# Patient Record
Sex: Female | Born: 1985 | Hispanic: No | Marital: Single | Smoking: Never smoker
Health system: Southern US, Community
[De-identification: ages and names within clinical notes are randomized; demographics above are authoritative.]

## PROBLEM LIST (undated history)

## (undated) DIAGNOSIS — F410 Panic disorder [episodic paroxysmal anxiety] without agoraphobia: Secondary | ICD-10-CM

## (undated) DIAGNOSIS — R569 Unspecified convulsions: Secondary | ICD-10-CM

## (undated) DIAGNOSIS — R011 Cardiac murmur, unspecified: Secondary | ICD-10-CM

## (undated) DIAGNOSIS — A64 Unspecified sexually transmitted disease: Secondary | ICD-10-CM

## (undated) DIAGNOSIS — F329 Major depressive disorder, single episode, unspecified: Secondary | ICD-10-CM

## (undated) DIAGNOSIS — I499 Cardiac arrhythmia, unspecified: Secondary | ICD-10-CM

## (undated) DIAGNOSIS — B977 Papillomavirus as the cause of diseases classified elsewhere: Secondary | ICD-10-CM

## (undated) DIAGNOSIS — F411 Generalized anxiety disorder: Secondary | ICD-10-CM

## (undated) HISTORY — DX: Generalized anxiety disorder: F41.1

## (undated) HISTORY — PX: TOOTH EXTRACTION: SUR596

## (undated) HISTORY — DX: Major depressive disorder, single episode, unspecified: F32.9

## (undated) HISTORY — DX: Panic disorder (episodic paroxysmal anxiety): F41.0

## (undated) HISTORY — DX: Unspecified convulsions: R56.9

## (undated) HISTORY — DX: Unspecified sexually transmitted disease: A64

## (undated) HISTORY — DX: Papillomavirus as the cause of diseases classified elsewhere: B97.7

## (undated) HISTORY — DX: Cardiac arrhythmia, unspecified: I49.9

---
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
## 2006-05-24 ENCOUNTER — Ambulatory Visit: Payer: Self-pay | Admitting: Psychiatry

## 2006-05-24 ENCOUNTER — Inpatient Hospital Stay (HOSPITAL_COMMUNITY): Admission: AD | Admit: 2006-05-24 | Discharge: 2006-05-27 | Payer: Self-pay | Admitting: Psychiatry

## 2015-02-27 DIAGNOSIS — G4700 Insomnia, unspecified: Secondary | ICD-10-CM | POA: Insufficient documentation

## 2015-02-27 DIAGNOSIS — K219 Gastro-esophageal reflux disease without esophagitis: Secondary | ICD-10-CM | POA: Insufficient documentation

## 2015-02-27 DIAGNOSIS — G40909 Epilepsy, unspecified, not intractable, without status epilepticus: Secondary | ICD-10-CM | POA: Insufficient documentation

## 2015-07-09 ENCOUNTER — Encounter: Payer: Self-pay | Admitting: *Deleted

## 2015-07-12 ENCOUNTER — Ambulatory Visit (INDEPENDENT_AMBULATORY_CARE_PROVIDER_SITE_OTHER): Payer: PRIVATE HEALTH INSURANCE | Admitting: Cardiology

## 2015-07-12 ENCOUNTER — Encounter: Payer: Self-pay | Admitting: Cardiology

## 2015-07-12 ENCOUNTER — Encounter: Payer: Self-pay | Admitting: *Deleted

## 2015-07-12 VITALS — BP 130/80 | HR 77 | Ht 64.0 in | Wt 233.0 lb

## 2015-07-12 DIAGNOSIS — I10 Essential (primary) hypertension: Secondary | ICD-10-CM | POA: Diagnosis not present

## 2015-07-12 DIAGNOSIS — F419 Anxiety disorder, unspecified: Secondary | ICD-10-CM | POA: Diagnosis not present

## 2015-07-12 DIAGNOSIS — R42 Dizziness and giddiness: Secondary | ICD-10-CM

## 2015-07-12 DIAGNOSIS — Z8679 Personal history of other diseases of the circulatory system: Secondary | ICD-10-CM | POA: Insufficient documentation

## 2015-07-12 DIAGNOSIS — R011 Cardiac murmur, unspecified: Secondary | ICD-10-CM

## 2015-07-12 DIAGNOSIS — R002 Palpitations: Secondary | ICD-10-CM | POA: Insufficient documentation

## 2015-07-12 LAB — T4, FREE: Free T4: 1 ng/dL (ref 0.8–1.8)

## 2015-07-12 LAB — COMPREHENSIVE METABOLIC PANEL
ALT: 24 U/L (ref 6–29)
AST: 15 U/L (ref 10–30)
Albumin: 4.2 g/dL (ref 3.6–5.1)
Alkaline Phosphatase: 107 U/L (ref 33–115)
BUN: 13 mg/dL (ref 7–25)
CO2: 20 mmol/L (ref 20–31)
Calcium: 9.2 mg/dL (ref 8.6–10.2)
Chloride: 107 mmol/L (ref 98–110)
Creat: 0.8 mg/dL (ref 0.50–1.10)
Glucose, Bld: 90 mg/dL (ref 65–99)
Potassium: 4.3 mmol/L (ref 3.5–5.3)
Sodium: 138 mmol/L (ref 135–146)
Total Bilirubin: 0.5 mg/dL (ref 0.2–1.2)
Total Protein: 7 g/dL (ref 6.1–8.1)

## 2015-07-12 LAB — CBC WITH DIFFERENTIAL/PLATELET
Basophils Absolute: 0 cells/uL (ref 0–200)
Basophils Relative: 0 %
Eosinophils Absolute: 118 cells/uL (ref 15–500)
Eosinophils Relative: 2 %
HCT: 39.2 % (ref 35.0–45.0)
Hemoglobin: 13.6 g/dL (ref 11.7–15.5)
Lymphocytes Relative: 35 %
Lymphs Abs: 2065 cells/uL (ref 850–3900)
MCH: 28.8 pg (ref 27.0–33.0)
MCHC: 34.7 g/dL (ref 32.0–36.0)
MCV: 82.9 fL (ref 80.0–100.0)
MPV: 8.7 fL (ref 7.5–12.5)
Monocytes Absolute: 531 cells/uL (ref 200–950)
Monocytes Relative: 9 %
Neutro Abs: 3186 cells/uL (ref 1500–7800)
Neutrophils Relative %: 54 %
Platelets: 249 10*3/uL (ref 140–400)
RBC: 4.73 MIL/uL (ref 3.80–5.10)
RDW: 13.4 % (ref 11.0–15.0)
WBC: 5.9 10*3/uL (ref 3.8–10.8)

## 2015-07-12 LAB — TSH: TSH: 1.17 mIU/L

## 2015-07-12 NOTE — Progress Notes (Signed)
Cardiology Office Note    Date:  07/12/2015   ID:  Catherine Shah, DOB [birth year], MRN [id number]  PCP:  Catherine Shah,HEATHER M., MD  Cardiologist:  Catherine Shah, Catherine Shah H, MD   No chief complaint on file.   History of Present Illness:  Catherine Shah is a 30 y.o. female with prior medical history of depression, anxiety and obesity who is coming for concerns of palpitations and dizziness. The patient states that she has been experiencing episodes of skipped beats and 2 seconds to minutes lasting palpitations that are associated with significant anxiety patient side however no associated shortness of breath chest pain or dizziness. She is also experiencing episodes of dizziness but no presyncope or syncope. The patient works as a Veterinary surgeoncounselor, and is planning to return back to school to get Master degree. She is seeing a mental health specialist for anxiety depression and panic attacks. She is also using niacin for weight loss. She states that in the past she used phentermine for weight loss and that's when her problems started. She has never smoked, her mother had stroke at age of 30 and passed away from complications. Her aunt had heart surgery in her 30s but she doesn't know the reason, several of her cousins have mitral valve prolapse.The patient also states that one occasion when she had episodes of dizziness her colic who is a nurse check her blood pressure and it was elevated but she doesn't remember the exact number.  Past Medical History  Diagnosis Date  . Generalized anxiety disorder   . Panic attacks   . Major depression (HCC)    No past surgical history on file.  Current Medications: No outpatient prescriptions prior to visit.   No facility-administered medications prior to visit.    Allergies:   Tape   Social History   Social History  . Marital Status: Single    Spouse Name: N/A  . Number of Children: 0  . Years of Education: N/A   Social History Main Topics  . Smoking status: Never Smoker    . Smokeless tobacco: Never Used  . Alcohol Use: No  . Drug Use: No  . Sexual Activity: Not on file   Other Topics Concern  . Not on file   Social History Narrative    Family History:  The patient's family history is not on file.   ROS:   Please see the history of present illness.    ROS All other systems reviewed and are negative.   PHYSICAL EXAM:   VS:  BP 130/80 mmHg  Pulse 77  Ht 5\' 4"  (1.626 m)  Wt 233 lb (105.688 kg)  BMI 39.97 kg/m2   GEN: Well nourished, well developed, in no acute distress HEENT: normal Neck: no JVD, carotid bruits, or masses Cardiac: RRR; 1/6 systolic murmurs, rubs, or gallops,no edema  Respiratory:  clear to auscultation bilaterally, normal work of breathing GI: soft, nontender, nondistended, + BS MS: no deformity or atrophy Skin: warm and dry, no rash Neuro:  Alert and Oriented x 3, Strength and sensation are intact Psych: euthymic mood, full affect  Wt Readings from Last 3 Encounters:  07/12/15 233 lb (105.688 kg)      Studies/Labs Reviewed:   EKG:  EKG is ordered today.  The ekg ordered today demonstrates SR, normal ECG.  Recent Labs: No results found for requested labs within last 365 days.   Lipid Panel No results found for: CHOL, TRIG, HDL, CHOLHDL, VLDL, LDLCALC, LDLDIRECT    ASSESSMENT:  1. Palpitations   2. Dizziness   3. Anxiety   4. Essential hypertension   5. Systolic murmur     PLAN:   We'll schedule 48 hour Holter monitor to evaluate for possible arrhythmias, considering her palpitations and prior use of phentermine we will evaluate echocardiogram for evaluation of systolic diastolic function and valvular abnormalities, she has short systolic murmur and phentermine is known for causing valvular regurgitation. We will also evaluate labs including electrolytes kidney and liver function and TSH and free T4. We will also check for CBC. On her echocardiogram we will also look for degree of LVH as she describes that  she had episodes of hypertension, this will help [place] determine if her hypertension is chronic only during episodes when she is stressed.   Medication Adjustments/Labs and Tests Ordered: Current medicines are reviewed at length with the patient today.  Concerns regarding medicines are outlined above.  Medication changes, Labs and Tests ordered today are listed in the Patient Instructions below. Patient Instructions  Medication Instructions:   Your physician recommends that you continue on your current medications as directed. Please refer to the Current Medication list given to you today.    Labwork:  TODAY--CMET, CBC W DIFF, TSH, AND FREE T 4    Testing/Procedures:  Your physician has requested that you have an echocardiogram. Echocardiography is a painless test that uses sound waves to create images of your heart. It provides your doctor with information about the size and shape of your heart and how well your heart's chambers and valves are working. This procedure takes approximately one hour. There are no restrictions for this procedure.   Your physician has recommended that you wear a 48 HOUR holter monitor. Holter monitors are medical devices that record the heart's electrical activity. Doctors most often use these monitors to diagnose arrhythmias. Arrhythmias are problems with the speed or rhythm of the heartbeat. The monitor is a small, portable device. You can wear one while you do your normal daily activities. This is usually used to diagnose what is causing palpitations/syncope (passing out).     Follow-Up:  AT DR Tyler Robidoux'S NEXT AVAILABLE APPOINTMENT--PLEASE HAVE ALL TESTING DONE PRIOR TOO THIS APPOINTMENT      If you need a refill on your cardiac medications before your next appointment, please call your pharmacy.      Signed, Catherine Masson, MD  07/12/2015 11:22 AM    [facility] [address] [place], [place], [place]  [address] Phone: [phone]; Fax: [phone]

## 2015-07-12 NOTE — Patient Instructions (Signed)
Medication Instructions:   Your physician recommends that you continue on your current medications as directed. Please refer to the Current Medication list given to you today.    Labwork:  TODAY--CMET, CBC W DIFF, TSH, AND FREE T 4    Testing/Procedures:  Your physician has requested that you have an echocardiogram. Echocardiography is a painless test that uses sound waves to create images of your heart. It provides your doctor with information about the size and shape of your heart and how well your heart's chambers and valves are working. This procedure takes approximately one hour. There are no restrictions for this procedure.   Your physician has recommended that you wear a 48 HOUR holter monitor. Holter monitors are medical devices that record the heart's electrical activity. Doctors most often use these monitors to diagnose arrhythmias. Arrhythmias are problems with the speed or rhythm of the heartbeat. The monitor is a small, portable device. You can wear one while you do your normal daily activities. This is usually used to diagnose what is causing palpitations/syncope (passing out).     Follow-Up:  AT DR NELSON'S NEXT AVAILABLE APPOINTMENT--PLEASE HAVE ALL TESTING DONE PRIOR TOO THIS APPOINTMENT      If you need a refill on your cardiac medications before your next appointment, please call your pharmacy.

## 2015-07-16 ENCOUNTER — Telehealth: Payer: Self-pay | Admitting: Cardiology

## 2015-07-16 NOTE — Telephone Encounter (Signed)
Left a message for the pt to call back to endorse lab results per Dr Nelson.  

## 2015-07-16 NOTE — Telephone Encounter (Signed)
Notes Recorded by Loa SocksIvy M Martin, LPN on 1/6/10965/10/2015 at 3:46 PM Notified the pt that per Dr Delton SeeNelson, her labs were normal, including her thyroid.  Pt verbalized understanding.

## 2015-07-16 NOTE — Telephone Encounter (Signed)
New message ° ° ° ° ° ° °Calling to get lab results °

## 2015-07-30 ENCOUNTER — Telehealth: Payer: Self-pay | Admitting: Cardiology

## 2015-07-30 ENCOUNTER — Ambulatory Visit (INDEPENDENT_AMBULATORY_CARE_PROVIDER_SITE_OTHER): Payer: PRIVATE HEALTH INSURANCE

## 2015-07-30 ENCOUNTER — Ambulatory Visit (HOSPITAL_COMMUNITY): Payer: PRIVATE HEALTH INSURANCE | Attending: Cardiology

## 2015-07-30 DIAGNOSIS — R002 Palpitations: Secondary | ICD-10-CM | POA: Insufficient documentation

## 2015-07-30 DIAGNOSIS — I071 Rheumatic tricuspid insufficiency: Secondary | ICD-10-CM | POA: Diagnosis not present

## 2015-07-30 DIAGNOSIS — R42 Dizziness and giddiness: Secondary | ICD-10-CM | POA: Diagnosis not present

## 2015-07-30 NOTE — Telephone Encounter (Signed)
Notified the pt that per Dr Delton SeeNelson, her echo showed normal LVEF, trivial MR, and mild TR, but overall normal echo.  Pt verbalized understanding.

## 2015-07-30 NOTE — Telephone Encounter (Signed)
Follow-up ° ° ° °The pt is calling back to get lab results °

## 2015-08-08 ENCOUNTER — Encounter: Payer: Self-pay | Admitting: Cardiology

## 2015-08-08 ENCOUNTER — Ambulatory Visit (INDEPENDENT_AMBULATORY_CARE_PROVIDER_SITE_OTHER): Payer: PRIVATE HEALTH INSURANCE | Admitting: Cardiology

## 2015-08-08 VITALS — BP 120/80 | HR 78 | Ht 64.0 in | Wt 232.4 lb

## 2015-08-08 DIAGNOSIS — I10 Essential (primary) hypertension: Secondary | ICD-10-CM

## 2015-08-08 DIAGNOSIS — R002 Palpitations: Secondary | ICD-10-CM

## 2015-08-08 DIAGNOSIS — R011 Cardiac murmur, unspecified: Secondary | ICD-10-CM | POA: Diagnosis not present

## 2015-08-08 DIAGNOSIS — R42 Dizziness and giddiness: Secondary | ICD-10-CM | POA: Diagnosis not present

## 2015-08-08 NOTE — Progress Notes (Signed)
Cardiology Office Note    Date:  08/09/2015   ID:  Catherine Shah, DOB 08-Feb-1986, MRN [id number]  PCP:  Raynelle Jan., MD  Cardiologist:  Tobias Alexander, MD   Chief complain: Palpitations  History of Present Illness:  Catherine Shah is a 30 y.o. female with prior medical history of depression, anxiety and obesity who is coming for concerns of palpitations and dizziness. The patient states that she has been experiencing episodes of skipped beats and 2 seconds to minutes lasting palpitations that are associated with significant anxiety patient side however no associated shortness of breath chest pain or dizziness. She is also experiencing episodes of dizziness but no presyncope or syncope. 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 She is seeing a mental health specialist for anxiety depression and panic attacks. She is also using niacin for weight loss. She states that in the past she used phentermine for weight loss and that's when her problems started. She has never smoked, her mother had stroke at age of 24 and passed away from complications. Her aunt had heart surgery in her 30s but she doesn't know the reason, several of her cousins have mitral valve prolapse.The patient also states that one occasion when she had episodes of dizziness her colic who is a nurse check her blood pressure and it was elevated but she doesn't remember the exact number.  3 weeks follow up, no change in symptoms, no presyncope or syncope in the interim. She underwent echocardiography with normal LVEF, trivial MR, and mild TR, no LVH. She has normal labs including TSH. 48 H Holter monitor showed Couple PVCs, infrequent PACs. No significant pauses.  Past Medical History  Diagnosis Date  . Generalized anxiety disorder   . Panic attacks   . Major depression (HCC)    No past surgical history on file.  Current Medications: Outpatient Prescriptions Prior to Visit    Medication Sig Dispense Refill  . amitriptyline (ELAVIL) 25 MG tablet Take 25 mg by mouth at bedtime.    Marland Kitchen CALCIUM-MAG-VIT C-VIT D PO Take 1 tablet by mouth daily.    . citalopram (CELEXA) 20 MG tablet Take 20 mg by mouth daily.    . clonazePAM (KLONOPIN) 0.5 MG tablet Take 0.5 mg by mouth daily as needed for anxiety.    Marland Kitchen doxycycline (DORYX) 150 MG EC tablet Take 150 mg by mouth daily.    Marland Kitchen etonogestrel (NEXPLANON) 68 MG IMPL implant 1 each by Subdermal route once.    . gabapentin (NEURONTIN) 800 MG tablet Take 800 mg by mouth 2 (two) times daily.    . niacin 500 MG tablet Take 500 mg by mouth at bedtime.     No facility-administered medications prior to visit.    Allergies:   Tape   Social History   Social History  . Marital Status: Single    Spouse Name: N/A  . Number of Children: 0  . Years of Education: N/A   Social History Main Topics  . Smoking status: Never Smoker   . Smokeless tobacco: Never Used  . Alcohol Use: No  . Drug Use: No  . Sexual Activity: Not Asked   Other Topics Concern  . None   Social History Narrative    Family History:  The patient's family history is not on file.   ROS:   Please see the history of present illness.    ROS All other systems reviewed and are negative.  PHYSICAL EXAM:   VS:  BP 120/80 mmHg  Pulse 78  Ht 5\' 4"  (1.626 m)  Wt 232 lb 6.4 oz (105.416 kg)  BMI 39.87 kg/m2   GEN: Well nourished, well developed, in no acute distress HEENT: normal Neck: no JVD, carotid bruits, or masses Cardiac: RRR; 1/6 systolic murmurs, rubs, or gallops,no edema  Respiratory:  clear to auscultation bilaterally, normal work of breathing GI: soft, nontender, nondistended, + BS MS: no deformity or atrophy Skin: warm and dry, no rash Neuro:  Alert and Oriented x 3, Strength and sensation are intact Psych: euthymic mood, full affect  Wt Readings from Last 3 Encounters:  08/08/15 232 lb 6.4 oz (105.416 kg)  07/12/15 233 lb (105.688 kg)       Studies/Labs Reviewed:   EKG:  EKG is ordered today.  The ekg ordered today demonstrates SR, normal ECG.  Recent Labs: 07/12/2015: ALT 24; BUN 13; Creat 0.80; Hemoglobin 13.6; Platelets 249; Potassium 4.3; Sodium 138; TSH 1.17   Lipid Panel No results found for: CHOL, TRIG, HDL, CHOLHDL, VLDL, LDLCALC, LDLDIRECT  TTE: 07/2015 - Left ventricle: The cavity size was normal. Systolic function was  normal. Wall motion was normal; there were no regional wall  motion abnormalities. - Aortic valve: Trileaflet; mildly thickened, mildly calcified  leaflets. - Mitral valve: There was trivial regurgitation.  Holter monitor: normal    ASSESSMENT:    1. Essential hypertension   2. Dizziness   3. Palpitations   4. Systolic murmur     PLAN:   Normal labs, normal echocardiogram and Holter monitor with only occasional PACs, PVS, no pauses or arrhythmias. Reassurance provided.  BP controlled today and no LVH on echocardiogram, no therapy recommended right now.  Normal labs.   Medication Adjustments/Labs and Tests Ordered: Current medicines are reviewed at length with the patient today.  Concerns regarding medicines are outlined above.  Medication changes, Labs and Tests ordered today are listed in the Patient Instructions below. Patient Instructions  Your physician recommends that you continue on your current medications as directed. Please refer to the Current Medication list given to you today.    Your physician wants you to follow-up in: ONE YEAR WITH DR Johnell ComingsNELSON You will receive a reminder letter in the mail two months in advance. If you don't receive a letter, please call our office to schedule the follow-up appointment.     Signed, Tobias AlexanderKatarina Maksymilian Mabey, MD  08/09/2015 11:04 PM    [facility] [address], [place], [place]  [address] Phone: [phone]; Fax: [phone]) [phone]

## 2015-08-08 NOTE — Patient Instructions (Signed)
Your physician recommends that you continue on your current medications as directed. Please refer to the Current Medication list given to you today.   Your physician wants you to follow-up in: ONE YEAR WITH DR NELSON You will receive a reminder letter in the mail two months in advance. If you don't receive a letter, please call our office to schedule the follow-up appointment.  

## 2016-06-26 ENCOUNTER — Encounter: Payer: PRIVATE HEALTH INSURANCE | Admitting: Podiatry

## 2016-07-07 NOTE — Progress Notes (Signed)
This encounter was created in error - please disregard.

## 2018-01-05 DIAGNOSIS — K645 Perianal venous thrombosis: Secondary | ICD-10-CM | POA: Insufficient documentation

## 2018-11-06 DIAGNOSIS — M25562 Pain in left knee: Secondary | ICD-10-CM | POA: Insufficient documentation

## 2018-11-06 DIAGNOSIS — Z6841 Body Mass Index (BMI) 40.0 and over, adult: Secondary | ICD-10-CM | POA: Insufficient documentation

## 2019-02-09 ENCOUNTER — Other Ambulatory Visit: Payer: Self-pay

## 2019-02-09 DIAGNOSIS — Z20822 Contact with and (suspected) exposure to covid-19: Secondary | ICD-10-CM

## 2019-02-11 LAB — NOVEL CORONAVIRUS, NAA: SARS-CoV-2, NAA: NOT DETECTED

## 2019-07-25 ENCOUNTER — Other Ambulatory Visit: Payer: Self-pay

## 2019-07-25 ENCOUNTER — Encounter: Payer: Self-pay | Admitting: Family Medicine

## 2019-07-25 ENCOUNTER — Ambulatory Visit (INDEPENDENT_AMBULATORY_CARE_PROVIDER_SITE_OTHER): Payer: BC Managed Care – PPO | Admitting: Family Medicine

## 2019-07-25 VITALS — BP 118/82 | HR 81 | Temp 98.1°F | Ht 63.75 in | Wt 248.4 lb

## 2019-07-25 DIAGNOSIS — M25561 Pain in right knee: Secondary | ICD-10-CM

## 2019-07-25 DIAGNOSIS — I10 Essential (primary) hypertension: Secondary | ICD-10-CM

## 2019-07-25 DIAGNOSIS — G8929 Other chronic pain: Secondary | ICD-10-CM

## 2019-07-25 DIAGNOSIS — Z7689 Persons encountering health services in other specified circumstances: Secondary | ICD-10-CM

## 2019-07-25 DIAGNOSIS — Z6841 Body Mass Index (BMI) 40.0 and over, adult: Secondary | ICD-10-CM

## 2019-07-25 DIAGNOSIS — M25562 Pain in left knee: Secondary | ICD-10-CM

## 2019-07-25 NOTE — Patient Instructions (Signed)
Instant pot recipes- Amy and Richelle Ito resource that I like is www.dietdoctor.com You tube- Dr. Wylene Simmer, Dr. Shanda Howells  Here are some guidelines to help you with meal planning -  Avoid all processed and packaged foods (bread, pasta, crackers, chips, etc) and beverages containing calories.  Avoid added sugars and excessive natural sugars.  Attention to how you feel if you consume artificial sweeteners.  Do they make you more hungry or raise your blood sugar?  With every meal and snack, aim to get 20 g of protein (3 ounces of meat, 4 ounces of fish, 3 eggs, protein powder, 1 cup [place] yogurt, 1 cup cottage cheese, etc.)  Increase fiber in the form of non-starchy vegetables.  These help you feel full with very little carbohydrates and are good for gut health.  Eat 1 serving healthy carb per meal- 1/2 cup brown rice, beans, potato, corn- pay attention to whether or not this significantly raises your blood sugar. If it does, reduce the frequency you consume these.   Eat 2-3 servings of lower sugar fruits daily.  This includes berries, apples, oranges, peaches, pears, one half banana.  Have small amounts of good fats such as avocado, nuts, olive oil, nut butters, olives.  Add a little cheese to your salads to make them tasty.

## 2019-07-25 NOTE — Progress Notes (Signed)
Subjective:    Patient ID: Catherine Shah, female    DOB: 02/14/1986, 34 y.o.   MRN: [id number]  HPI Chief Complaint  Patient presents with  . New Patient (Initial Visit)    Estab Care   This is a 34 yo female who presents today to establish care. 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 Enjoys live music, yard work. Has poodle and a cat.   Last CPE- 8/20 Pap-8/20 Tdap- 11/03/2018 Flu- annual Eye- unknown Dental- regular Exercise- not regular Sleep- ok, 5-6 hours, feels rested, no snoring, no apnea Diet- has been trying to cook more. Takes her lunch- salad. Breakfast- overnight oats, Dinner- fast food  Chronic knee pain- takes meloxicam prn and gabapentin. Had PT in past. Some help with heat.   Chest pain- had an episode of sternal pain, relieved with meloxicam.    Review of Systems No SOB, abdominal pain, diarrhea, constipation, occasional back pain. No dysuria, hematuria, menstrual problems.     Objective:   Physical Exam Vitals reviewed.  Constitutional:      General: She is not in acute distress.    Appearance: Normal appearance. She is obese. She is not ill-appearing, toxic-appearing or diaphoretic.  HENT:     Head: Normocephalic and atraumatic.  Eyes:     Conjunctiva/sclera: Conjunctivae normal.  Cardiovascular:     Rate and Rhythm: Normal rate and regular rhythm.     Heart sounds: Normal heart sounds.  Musculoskeletal:     Right lower leg: No edema.     Left lower leg: No edema.  Skin:    General: Skin is warm and dry.  Neurological:     Mental Status: She is alert and oriented to person, place, and time.  Psychiatric:        Mood and Affect: Mood normal.        Behavior: Behavior normal.        Thought Content: Thought content normal.        Judgment: Judgment normal.      BP 118/82 (BP Location: Left Arm, Patient Position: Sitting, Cuff Size: Large)   Pulse 81   Temp 98.1 F (36.7 C)  (Temporal)   Ht 5' 3.75" (1.619 m)   Wt 248 lb 6.4 oz (112.7 kg)   SpO2 98%   BMI 42.97 kg/m  Wt Readings from Last 3 Encounters:  07/25/19 248 lb 6.4 oz (112.7 kg)  08/08/15 232 lb 6.4 oz (105.4 kg)  07/12/15 233 lb (105.7 kg)        Assessment & Plan:  1. Encounter to establish care - Reviewed available records, she will return for CPE/ labs  2. Morbid obesity with body mass index (BMI) of 40.0 to 44.9 in adult [facility]) - she is interested in referral to dietician - Amb ref to Medical Nutrition Therapy-MNT  3. Essential hypertension - normal blood pressure off medication, will continue to monitor  4. Chronic pain of both knees - encouraged her to be as active as possible and resume exercises she learned in PT which had been helpful.   - follow up for cpe in 3 months  This visit occurred during the SARS-CoV-2 public health emergency.  Safety protocols were in place, including screening questions prior to the visit, additional usage of staff PPE, and extensive cleaning of exam room while observing appropriate contact time as indicated for disinfecting solutions.      Clarene Reamer, FNP-BC  [facility], [facility]  07/30/2019 6:35 AM

## 2019-07-30 ENCOUNTER — Encounter: Payer: Self-pay | Admitting: Family Medicine

## 2019-08-31 ENCOUNTER — Encounter: Payer: Self-pay | Admitting: Dietician

## 2019-08-31 ENCOUNTER — Other Ambulatory Visit: Payer: Self-pay

## 2019-08-31 ENCOUNTER — Encounter: Payer: BC Managed Care – PPO | Attending: Family Medicine | Admitting: Dietician

## 2019-08-31 DIAGNOSIS — Z6841 Body Mass Index (BMI) 40.0 and over, adult: Secondary | ICD-10-CM | POA: Diagnosis not present

## 2019-08-31 NOTE — Patient Instructions (Signed)
·   Use menus and easy recipes to plan ahead for homemade, balanced meals.   Resume using MyFitnessPal to track food intake and monitor calories, carbs, etc. Goal for calories is at least 1300, up to 1500.  Work on slower eating to improve fulness and portion control. Try chewing each bite more, putting fork down between bites, taking smaller bites, drinking water after each few bites of food.   Make use of "down" time at work to increase walking/ moving.

## 2019-08-31 NOTE — Progress Notes (Signed)
Medical Nutrition Therapy: Visit start time: 0910  end time: 1010  Assessment:  Diagnosis: obesity Past medical history: none significant Psychosocial issues/ stress concerns: taking antidepressant medication  Preferred learning method:   Hands-on   Current weight: 245.1lbs  Height: 5'4" Medications, supplements: reconciled list in medical record  Progress and evaluation:   Patient reports stable weight recently.  Tried [facility]oylene Bode, bariatric clinic with phentermine but had reaction. She worked with Systems analyst in the past, and participated in [place] and kick-boxing, but has not been able to maintain.   She is working 1 full-time and 1 part-time job, days and evenings. She reports she does not enjoy cooking or grocery shopping.   Currently taking wellbutrin to help with appetite control  Physical activity: none currently; work is sedentary  Dietary Intake:  Usual eating pattern includes 3 meals and 2-3 snacks per day. Dining out frequency: 6 meals per week.  Breakfast: overnight oats; occ [employer] Mcmuffin; cottage cheese with fruit Snack: yogurt or cottage cheese Lunch: salad; fast foods ie burger and fries Snack: apple or yogurt Supper: some fast food; starting to use instapot -- shrimp and rice 6/22 Snack: "weak" time -- carbs ie chips, crackers, cookies, etc. Beverages: water; coffee/ iced coffee in am; diet coke  Nutrition Care Education: Topics covered:  Basic nutrition: basic food groups, appropriate nutrient balance, appropriate meal and snack schedule, general nutrition guidelines    Weight control: importance of low sugar and low fat choices, portion control strategies, ways to slow down eating time; estimated energy needs at 1300-1500kcal, provided guidance for 50% CHO, 20% protein; tracking food intake; role of exercise Advanced nutrition: cooking techniques-- quick meal ideas, pre-prepping foods; dining out-- healthy meal options  Nutritional Diagnosis:   Vaughn-3.3 Overweight/obesity As related to excess calories and inactivity.  As evidenced by patient with current BMI of 42.  Intervention:   Instruction and discussion as noted above.  Focused on quick and simple options for healthy meals to match patient's lifestyle and preferences.   Established goals for change with direction from patient.  Education Materials given:   Designer, industrial/product with food lists, sample meal pattern  Sample menus  "Planned-over" meal ideas (2 handouts)  Goals/ instructions   Learner/ who was taught:   Patient   Level of understanding:  Verbalizes/ demonstrates competency   Demonstrated degree of understanding via:   Teach back Learning barriers:  None   Willingness to learn/ readiness for change:  Acceptance, ready for change   Monitoring and Evaluation:  Dietary intake, exercise, and body weight      follow up: 10/03/19 at 4:30pm

## 2019-10-03 ENCOUNTER — Ambulatory Visit: Payer: BC Managed Care – PPO | Admitting: Dietician

## 2019-10-14 ENCOUNTER — Other Ambulatory Visit: Payer: Self-pay | Admitting: Family Medicine

## 2019-10-14 DIAGNOSIS — Z113 Encounter for screening for infections with a predominantly sexual mode of transmission: Secondary | ICD-10-CM

## 2019-10-14 DIAGNOSIS — I10 Essential (primary) hypertension: Secondary | ICD-10-CM

## 2019-10-21 ENCOUNTER — Other Ambulatory Visit (INDEPENDENT_AMBULATORY_CARE_PROVIDER_SITE_OTHER): Payer: BC Managed Care – PPO

## 2019-10-21 ENCOUNTER — Other Ambulatory Visit: Payer: Self-pay

## 2019-10-21 DIAGNOSIS — Z6841 Body Mass Index (BMI) 40.0 and over, adult: Secondary | ICD-10-CM | POA: Diagnosis not present

## 2019-10-21 DIAGNOSIS — I10 Essential (primary) hypertension: Secondary | ICD-10-CM

## 2019-10-21 DIAGNOSIS — Z113 Encounter for screening for infections with a predominantly sexual mode of transmission: Secondary | ICD-10-CM

## 2019-10-21 LAB — CBC WITH DIFFERENTIAL/PLATELET
Basophils Absolute: 0 10*3/uL (ref 0.0–0.1)
Basophils Relative: 0.6 % (ref 0.0–3.0)
Eosinophils Absolute: 0.2 10*3/uL (ref 0.0–0.7)
Eosinophils Relative: 2.8 % (ref 0.0–5.0)
HCT: 37.5 % (ref 36.0–46.0)
Hemoglobin: 12.6 g/dL (ref 12.0–15.0)
Lymphocytes Relative: 28 % (ref 12.0–46.0)
Lymphs Abs: 1.8 10*3/uL (ref 0.7–4.0)
MCHC: 33.6 g/dL (ref 30.0–36.0)
MCV: 86.3 fl (ref 78.0–100.0)
Monocytes Absolute: 0.4 10*3/uL (ref 0.1–1.0)
Monocytes Relative: 6 % (ref 3.0–12.0)
Neutro Abs: 4.1 10*3/uL (ref 1.4–7.7)
Neutrophils Relative %: 62.6 % (ref 43.0–77.0)
Platelets: 271 10*3/uL (ref 150.0–400.0)
RBC: 4.35 Mil/uL (ref 3.87–5.11)
RDW: 13 % (ref 11.5–15.5)
WBC: 6.6 10*3/uL (ref 4.0–10.5)

## 2019-10-21 LAB — VITAMIN D 25 HYDROXY (VIT D DEFICIENCY, FRACTURES): VITD: 21.65 ng/mL — ABNORMAL LOW (ref 30.00–100.00)

## 2019-10-21 LAB — TSH: TSH: 2.76 u[IU]/mL (ref 0.35–4.50)

## 2019-10-21 LAB — COMPREHENSIVE METABOLIC PANEL
ALT: 13 U/L (ref 0–35)
AST: 11 U/L (ref 0–37)
Albumin: 4.2 g/dL (ref 3.5–5.2)
Alkaline Phosphatase: 66 U/L (ref 39–117)
BUN: 12 mg/dL (ref 6–23)
CO2: 21 mEq/L (ref 19–32)
Calcium: 8.8 mg/dL (ref 8.4–10.5)
Chloride: 109 mEq/L (ref 96–112)
Creatinine, Ser: 0.92 mg/dL (ref 0.40–1.20)
GFR: 69.7 mL/min (ref >60.00)
Glucose, Bld: 90 mg/dL (ref 70–99)
Potassium: 4.2 mEq/L (ref 3.5–5.1)
Sodium: 139 mEq/L (ref 135–145)
Total Bilirubin: 0.5 mg/dL (ref 0.2–1.2)
Total Protein: 6.8 g/dL (ref 6.0–8.3)

## 2019-10-21 LAB — LIPID PANEL
Cholesterol: 185 mg/dL (ref 0–200)
HDL: 43.4 mg/dL (ref >39.00)
LDL Cholesterol: 125 mg/dL — ABNORMAL HIGH (ref 0–99)
NonHDL: 141.52
Total CHOL/HDL Ratio: 4
Triglycerides: 85 mg/dL (ref 0.0–149.0)
VLDL: 17 mg/dL (ref 0.0–40.0)

## 2019-10-21 LAB — HEMOGLOBIN A1C: Hgb A1c MFr Bld: 5 % (ref 4.6–6.5)

## 2019-10-21 NOTE — Addendum Note (Signed)
Addended by: Alvina Chou on: 10/21/2019 11:38 AM   Modules accepted: Orders

## 2019-10-24 LAB — HIV ANTIBODY (ROUTINE TESTING W REFLEX): HIV 1&2 Ab, 4th Generation: NONREACTIVE

## 2019-10-24 LAB — RPR: RPR Ser Ql: NONREACTIVE

## 2019-10-26 LAB — C. TRACHOMATIS/N. GONORRHOEAE RNA
C. trachomatis RNA, TMA: NOT DETECTED
N. gonorrhoeae RNA, TMA: NOT DETECTED

## 2019-10-26 LAB — TRICHOMONAS VAGINALIS, PROBE AMP: Trichomonas vaginalis RNA: NOT DETECTED

## 2019-10-28 ENCOUNTER — Encounter: Payer: Self-pay | Admitting: Family Medicine

## 2019-10-28 ENCOUNTER — Ambulatory Visit (INDEPENDENT_AMBULATORY_CARE_PROVIDER_SITE_OTHER): Payer: BC Managed Care – PPO | Admitting: Family Medicine

## 2019-10-28 ENCOUNTER — Other Ambulatory Visit: Payer: Self-pay

## 2019-10-28 VITALS — BP 118/70 | HR 95 | Temp 98.1°F | Ht 63.5 in | Wt 241.5 lb

## 2019-10-28 DIAGNOSIS — Z6841 Body Mass Index (BMI) 40.0 and over, adult: Secondary | ICD-10-CM

## 2019-10-28 DIAGNOSIS — Z309 Encounter for contraceptive management, unspecified: Secondary | ICD-10-CM

## 2019-10-28 DIAGNOSIS — E559 Vitamin D deficiency, unspecified: Secondary | ICD-10-CM

## 2019-10-28 DIAGNOSIS — Z0000 Encounter for general adult medical examination without abnormal findings: Secondary | ICD-10-CM | POA: Diagnosis not present

## 2019-10-28 MED ORDER — ETONOGESTREL-ETHINYL ESTRADIOL 0.12-0.015 MG/24HR VA RING: 1.0000 | VAGINAL_RING | VAGINAL | 4 refills | Status: DC

## 2019-10-28 NOTE — Patient Instructions (Addendum)
Vitamin D3- 1,000 IU daily  There is not one right eating plan for everyone.  It may take trial and error to find what will work for you.  It is important to get adequate protein and fiber with your meals.  It is okay to not eat breakfast or to skip meals if you are not hungry.  Avoid snacking between meals.  Unless you are on a fluid restriction, drink 80 to 90 ounces of water a day.  Suggested resources- www.dietdoctor.com/diabetes/diet www.adaptyourlifeacademy.com-there is a quiz to help you determine how many carbohydrates you should eat a day  www.thefastingmethod.com  Increase fiber in the form of non-starchy vegetables.  These help you feel full with very little carbohydrates and are good for gut health.  Nonstarchy vegetables include summer squash, onions, peppers, tomatoes, eggplant, broccoli, cauliflower, cabbage, lettuce, spinach.  Have small amounts of good fats such as avocado, nuts, olive oil, nut butters, olives.  Add a little cheese to your meals to make them tasty.   Try to plan your meals for the week and do some meal preparation when able.  If possible, make lunches for the week ahead of time.  Plan a couple of dinners and make enough so you can have leftovers.  Build in a treat once a week.     Vitamin D Deficiency Vitamin D deficiency is when your body does not have enough vitamin D. Vitamin D is important to your body because:  It helps your body use other minerals.  It helps to keep your bones strong and healthy.  It may help to prevent some diseases.  It helps your heart and other muscles work well. Not getting enough vitamin D can make your bones soft. It can also cause other health problems. What are the causes? This condition may be caused by:  Not eating enough foods that contain vitamin D.  Not getting enough sun.  Having diseases that make it hard for your body to absorb vitamin D.  Having a surgery in which a part of the stomach or a part of the  small intestine is removed.  Having kidney disease or liver disease. What increases the risk? You are more likely to get this condition if:  You are older.  You do not spend much time outdoors.  You live in a nursing home.  You have had broken bones.  You have weak or thin bones (osteoporosis).  You have a disease or condition that changes how your body absorbs vitamin D.  You have dark skin.  You take certain medicines.  You are overweight or obese. What are the signs or symptoms?  In mild cases, there may not be any symptoms. If the condition is very bad, symptoms may include: ? Bone pain. ? Muscle pain. ? Falling often. ? Broken bones caused by a minor injury. How is this treated? Treatment may include taking supplements as told by your doctor. Your doctor will tell you what dose is best for you. Supplements may include:  Vitamin D.  Calcium. Follow these instructions at home: Eating and drinking   Eat foods that contain vitamin D, such as: ? Dairy products, cereals, or juices with added vitamin D. Check the label. ? Fish, such as salmon or trout. ? Eggs. ? Oysters. ? Mushrooms. The items listed above may not be a complete list of what you can eat and drink. Contact a dietitian for more options. General instructions  Take medicines and supplements only as told by your doctor.  Get regular, safe exposure to natural sunlight.  Do not use a tanning bed.  Maintain a healthy weight. Lose weight if needed.  Keep all follow-up visits as told by your doctor. This is important. How is this prevented?  You can get vitamin D by: ? Eating foods that naturally contain vitamin D. ? Eating or drinking products that have vitamin D added to them, such as cereals, juices, and milk. ? Taking vitamin D or a multivitamin that contains vitamin D. ? Being in the sun. Your body makes vitamin D when your skin is exposed to sunlight. Your body changes the sunlight into a form  of the vitamin that it can use. Contact a doctor if:  Your symptoms do not go away.  You feel sick to your stomach (nauseous).  You throw up (vomit).  You poop less often than normal, or you have trouble pooping (constipation). Summary  Vitamin D deficiency is when your body does not have enough vitamin D.  Vitamin D helps to keep your bones strong and healthy.  This condition is often treated by taking a supplement.  Your doctor will tell you what dose is best for you. This information is not intended to replace advice given to you by your health care provider. Make sure you discuss any questions you have with your health care provider. Document Revised: 11/02/2017 Document Reviewed: 11/02/2017 Elsevier Patient Education  2020 [employer].

## 2019-10-28 NOTE — Progress Notes (Signed)
Subjective:    Patient ID: Catherine Shah, female    DOB: 03-11-1985, 34 y.o.   MRN: [id number]  HPI Chief Complaint  Patient presents with  . Annual Exam    Last CPE- 8/20 Mammo-8/20 Pap- 8/20 Tdap- 11/03/2018 Flu-annual Covid 19 vaccine-fully vaccinated Eye- not for several years, planning to have soon Dental- regular Exercise- not regular  Obesity- met with dietician. Has lost 7 pounds.  Had been doing better with making healthy food choices then went out of town.  Has been busy recently and not eating as much.  Has been trying to prepare more foods at home and eat out less.  Using NuvaRing for birth control.  This is working well for her she does not remember daily medication.  Menses not heavy, painful or long.    Review of Systems  Constitutional: Negative.   HENT: Negative.   Eyes: Negative.   Respiratory: Negative.   Cardiovascular: Negative.   Gastrointestinal: Positive for diarrhea (usually one bm daily, loose. ). Negative for abdominal pain and constipation.  Endocrine: Negative.   Genitourinary: Negative.   Musculoskeletal:       Bilateral knee pain, takes gabapentin at bedtime, occasional meloxicam.   Skin: Negative.   Allergic/Immunologic: Negative.   Neurological: Negative.   Hematological: Negative.   Psychiatric/Behavioral: Negative.        Objective:   Physical Exam Physical Exam  Constitutional: She is oriented to person, place, and time. She appears well-developed and well-nourished. No distress.  HENT:  Head: Normocephalic and atraumatic.  Right Ear: External ear normal. TM normal.  Left Ear: External ear normal. TM normal.  Nose: Nose normal.  Mouth/Throat: Oropharynx is clear and moist. No oropharyngeal exudate.  Eyes: Conjunctivae are normal.   Neck: Normal range of motion. Neck supple. No JVD present. No thyromegaly present.  Cardiovascular: Normal rate, regular rhythm, normal heart sounds and intact distal pulses.   Pulmonary/Chest:  Effort normal and breath sounds normal. Right breast exhibits no inverted nipple, no mass, no nipple discharge, no skin change and no tenderness. Left breast exhibits no inverted nipple, no mass, no nipple discharge, no skin change and no tenderness. Breasts are symmetrical.  Abdominal: Soft. Bowel sounds are normal. She exhibits no distension and no mass. There is no tenderness. There is no rebound and no guarding.  Musculoskeletal: Normal range of motion. She exhibits no edema or tenderness.  Lymphadenopathy:    She has no cervical adenopathy.  Neurological: She is alert and oriented to person, place, and time.   Skin: Skin is warm and dry. She is not diaphoretic.  Psychiatric: She has a normal mood and affect. Her behavior is normal. Judgment and thought content normal.  Vitals reviewed.     BP 118/70   Pulse 95   Temp 98.1 F (36.7 C) (Temporal)   Ht 5' 3.5" (1.613 m)   Wt 241 lb 8 oz (109.5 kg)   LMP 10/25/2019   SpO2 96%   BMI 42.11 kg/m  Wt Readings from Last 3 Encounters:  10/28/19 241 lb 8 oz (109.5 kg)  08/31/19 245 lb 1.6 oz (111.2 kg)  07/25/19 248 lb 6.4 oz (112.7 kg)       Depression screen [facility] 2/9 10/28/2019 08/31/2019 07/25/2019  Decreased Interest 0 0 0  Down, Depressed, Hopeless 0 0 0  PHQ - 2 Score 0 0 0    Assessment & Plan:  1. Annual physical exam - Discussed and encouraged healthy lifestyle choices- adequate sleep, regular exercise, stress  management and healthy food choices.    2. Encounter for contraceptive management, unspecified type - etonogestrel-ethinyl estradiol (NUVARING) 0.12-0.015 MG/24HR vaginal ring; Place 1 each vaginally every 28 (twenty-eight) days. Insert vaginally and leave in place for 3 consecutive weeks, then remove for 1 week.  Dispense: 3 each; Refill: 4  3. Class 3 severe obesity due to excess calories without serious comorbidity with body mass index (BMI) of 40.0 to 44.9 in adult [facility]) -Has met with dietitian and has had some  weight loss.  Discussed barriers to healthy food choices, lower carb diet, intermittent fasting  4. Vitamin D deficiency -Advised her to start over-the-counter vitamin D3 1000 IU daily and counseled on sensible sun exposure  This visit occurred during the SARS-CoV-2 public health emergency.  Safety protocols were in place, including screening questions prior to the visit, additional usage of staff PPE, and extensive cleaning of exam room while observing appropriate contact time as indicated for disinfecting solutions.      Catherine Reamer, FNP-BC  [facility], [facility]  10/28/2019 10:06 AM

## 2019-10-31 ENCOUNTER — Ambulatory Visit: Payer: BC Managed Care – PPO | Admitting: Dietician

## 2019-11-07 ENCOUNTER — Encounter: Payer: Self-pay | Admitting: Dietician

## 2019-11-07 NOTE — Progress Notes (Signed)
Have not heard back from patient to reschedule her missed appointment from 10/31/19. Sent notification to referring provider.

## 2019-12-01 ENCOUNTER — Ambulatory Visit: Payer: BC Managed Care – PPO | Admitting: Internal Medicine

## 2019-12-01 ENCOUNTER — Other Ambulatory Visit: Payer: Self-pay

## 2019-12-01 ENCOUNTER — Encounter: Payer: Self-pay | Admitting: Internal Medicine

## 2019-12-01 VITALS — BP 120/80 | HR 84 | Temp 98.4°F | Wt 248.0 lb

## 2019-12-01 DIAGNOSIS — R300 Dysuria: Secondary | ICD-10-CM

## 2019-12-01 DIAGNOSIS — R350 Frequency of micturition: Secondary | ICD-10-CM

## 2019-12-01 DIAGNOSIS — S80861A Insect bite (nonvenomous), right lower leg, initial encounter: Secondary | ICD-10-CM | POA: Diagnosis not present

## 2019-12-01 DIAGNOSIS — R3989 Other symptoms and signs involving the genitourinary system: Secondary | ICD-10-CM

## 2019-12-01 DIAGNOSIS — Z23 Encounter for immunization: Secondary | ICD-10-CM | POA: Diagnosis not present

## 2019-12-01 DIAGNOSIS — R109 Unspecified abdominal pain: Secondary | ICD-10-CM

## 2019-12-01 DIAGNOSIS — L089 Local infection of the skin and subcutaneous tissue, unspecified: Secondary | ICD-10-CM

## 2019-12-01 DIAGNOSIS — W57XXXA Bitten or stung by nonvenomous insect and other nonvenomous arthropods, initial encounter: Secondary | ICD-10-CM

## 2019-12-01 LAB — POC URINALSYSI DIPSTICK (AUTOMATED)
Bilirubin, UA: NEGATIVE
Blood, UA: NEGATIVE
Glucose, UA: NEGATIVE
Ketones, UA: NEGATIVE
Leukocytes, UA: NEGATIVE
Nitrite, UA: NEGATIVE
Protein, UA: NEGATIVE
Spec Grav, UA: >=1.03 — AB (ref 1.010–1.025)
Urobilinogen, UA: 0.2 E.U./dL
pH, UA: 6 (ref 5.0–8.0)

## 2019-12-01 MED ORDER — CEPHALEXIN 500 MG PO CAPS
500.0000 mg | ORAL_CAPSULE | Freq: Three times a day (TID) | ORAL | 0 refills | Status: DC
Start: 2019-12-01 — End: 2020-01-24

## 2019-12-01 NOTE — Patient Instructions (Signed)

## 2019-12-01 NOTE — Progress Notes (Signed)
Subjective:    Patient ID: Catherine Shah, female    DOB: 1985-12-09, 34 y.o.   MRN: [id number]  HPI  Pt presents to the clinic today with c/o insect bite to her right thigh. This occurred 4 days ago. The area is red, warm and tender to touch. She has not noticed any drainage from the area. She denies fever, chills or body ache. She has tried Hydrocotisone cream OTC with minimal relief.   She also reports left flank pain. This started a few days ago. The flank pain radiates around into her left lower abdomen. She reports associated bladder pressure, urgency, frequency and dysuria. Pt stats that she is experiencing burning and pain with urination. She denies vaginal symptoms at this time. She has not taken anything OTC for theses symptoms.   Review of Systems  Past Medical History:  Diagnosis Date  . Arrhythmia   . Generalized anxiety disorder   . HPV (human papilloma virus) infection   . Major depression   . Panic attacks   . Seizure (HCC)    last in 2013    Current Outpatient Medications  Medication Sig Dispense Refill  . buPROPion (WELLBUTRIN SR) 100 MG 12 hr tablet Take 100 mg by mouth 2 (two) times daily.     . citalopram (CELEXA) 40 MG tablet Take 40 mg by mouth daily.    Marland Kitchen etonogestrel-ethinyl estradiol (NUVARING) 0.12-0.015 MG/24HR vaginal ring Place 1 each vaginally every 28 (twenty-eight) days. Insert vaginally and leave in place for 3 consecutive weeks, then remove for 1 week. 3 each 4  . gabapentin (NEURONTIN) 100 MG capsule Take 100 mg by mouth 3 (three) times daily as needed.    . gabapentin (NEURONTIN) 800 MG tablet Take 800 mg by mouth at bedtime.     . meloxicam (MOBIC) 7.5 MG tablet Take 1 tablet by mouth as needed.    . Turmeric 400 MG CAPS Take 1 capsule by mouth daily.     No current facility-administered medications for this visit.    Allergies  Allergen Reactions  . Tape Other (See Comments)    sensitivity    Family History  Problem Relation Age of Onset    . Depression Mother   . Diabetes Mother   . Stroke Mother   . Cancer Father   . Coronary artery disease Other        [facility]  . Cancer Other        [facility]  . Stroke Other        [facility]  . Lung disease Other        [facility]  . Depression Maternal Grandmother     Social History   Socioeconomic History  . Marital status: Single    Spouse name: Not on file  . Number of children: 0  . Years of education: Not on file  . Highest education level: Not on file  Occupational History  . Occupation: Child psychotherapist  Tobacco Use  . Smoking status: Never Smoker  . Smokeless tobacco: Never Used  Vaping Use  . Vaping Use: Never used  Substance and Sexual Activity  . Alcohol use: Yes    Alcohol/week: 2.0 standard drinks    Types: 2 Standard drinks or equivalent per week  . Drug use: No  . Sexual activity: Not on file  Other Topics Concern  . Not on file  Social History Narrative  . Not on file   Social Determinants of Health   Financial Resource Strain:   .  Difficulty of Paying Living Expenses: Not on file  Food Insecurity:   . Worried About Programme researcher, broadcasting/film/video in the Last Year: Not on file  . Ran Out of Food in the Last Year: Not on file  Transportation Needs:   . Lack of Transportation (Medical): Not on file  . Lack of Transportation (Non-Medical): Not on file  Physical Activity:   . Days of Exercise per Week: Not on file  . Minutes of Exercise per Session: Not on file  Stress:   . Feeling of Stress : Not on file  Social Connections:   . Frequency of Communication with Friends and Family: Not on file  . Frequency of Social Gatherings with Friends and Family: Not on file  . Attends Religious Services: Not on file  . Active Member of Clubs or Organizations: Not on file  . Attends Banker Meetings: Not on file  . Marital Status: Not on file  Intimate Partner Violence:   . Fear of Current or Ex-Partner: Not on file  . Emotionally Abused: Not on file  . Physically Abused:  Not on file  . Sexually Abused: Not on file     Constitutional: Denies fever, malaise, fatigue, headache or abrupt weight changes.  Respiratory: Denies difficulty breathing, shortness of breath, cough or sputum production.   Cardiovascular: Denies chest pain, chest tightness, palpitations or swelling in the hands or feet.  GU: Pt reports urgency, frequency, dysuria, bladder pressure and left flank pain.  Pt denies blood in urine, odor or discharge. Skin: Pt reports insect bite to right thigh. Pt denies rashes, lesions or ulcercations.    No other specific complaints in a complete review of systems (except as listed in HPI above).     Objective:   Physical Exam  BP 120/80   Pulse 84   Temp 98.4 F (36.9 C) (Temporal)   Wt 248 lb (112.5 kg)   SpO2 97%   BMI 43.24 kg/m   Wt Readings from Last 3 Encounters:  10/28/19 241 lb 8 oz (109.5 kg)  08/31/19 245 lb 1.6 oz (111.2 kg)  07/25/19 248 lb 6.4 oz (112.7 kg)    General: Appears her stated age, obese, in NAD. Skin: 3.5 x 3.5 cm area of erythema, tender to touch noted of her right posterior thigh. Cardiovascular: Normal rate and rhythm. S1,S2 noted.  No murmur, rubs or gallops noted. Pulmonary/Chest: Normal effort and positive vesicular breath sounds. No respiratory distress. No wheezes, rales or ronchi noted.  Abdomen: Soft, nontender, normal bowel sounds. No CVA tenderness. Neurological: Alert and oriented.    BMET    Component Value Date/Time   NA 139 10/21/2019 0758   K 4.2 10/21/2019 0758   CL 109 10/21/2019 0758   CO2 21 10/21/2019 0758   GLUCOSE 90 10/21/2019 0758   BUN 12 10/21/2019 0758   CREATININE 0.92 10/21/2019 0758   CREATININE 0.80 07/12/2015 1139   CALCIUM 8.8 10/21/2019 0758    Lipid Panel     Component Value Date/Time   CHOL 185 10/21/2019 0758   TRIG 85.0 10/21/2019 0758   HDL 43.40 10/21/2019 0758   CHOLHDL 4 10/21/2019 0758   VLDL 17.0 10/21/2019 0758   LDLCALC 125 (H) 10/21/2019 0758     CBC    Component Value Date/Time   WBC 6.6 10/21/2019 0758   RBC 4.35 10/21/2019 0758   HGB 12.6 10/21/2019 0758   HCT 37.5 10/21/2019 0758   PLT 271.0 10/21/2019 0758   MCV 86.3  10/21/2019 0758   MCH 28.8 07/12/2015 1139   MCHC 33.6 10/21/2019 0758   RDW 13.0 10/21/2019 0758   LYMPHSABS 1.8 10/21/2019 0758   MONOABS 0.4 10/21/2019 0758   EOSABS 0.2 10/21/2019 0758   BASOSABS 0.0 10/21/2019 0758    Hgb A1C Lab Results  Component Value Date   HGBA1C 5.0 10/21/2019           Assessment & Plan:   Infected Insect Bite, Right Thigh:   RX for Keflex 500 mg TID x 7 days Continue Hydrocortisone cream OTC  Left Flank Pain, Urinary Urgency, Urinary Frequency, Dysuria:  Urinalysis: normal Will send urine culture  Push fluids  Return precautions discussed   Nicki Reaper, NP This visit occurred during the SARS-CoV-2 public health emergency.  Safety protocols were in place, including screening questions prior to the visit, additional usage of staff PPE, and extensive cleaning of exam room while observing appropriate contact time as indicated for disinfecting solutions.

## 2019-12-02 LAB — URINE CULTURE
MICRO NUMBER:: 10987798
SPECIMEN QUALITY:: ADEQUATE

## 2020-01-24 ENCOUNTER — Encounter: Payer: Self-pay | Admitting: Family Medicine

## 2020-01-24 ENCOUNTER — Other Ambulatory Visit: Payer: Self-pay

## 2020-01-24 ENCOUNTER — Telehealth: Payer: Self-pay | Admitting: Family Medicine

## 2020-01-24 ENCOUNTER — Ambulatory Visit: Payer: BC Managed Care – PPO | Admitting: Family Medicine

## 2020-01-24 VITALS — BP 136/70 | HR 88 | Temp 98.3°F | Ht 63.0 in | Wt 253.6 lb

## 2020-01-24 DIAGNOSIS — S060X0D Concussion without loss of consciousness, subsequent encounter: Secondary | ICD-10-CM | POA: Diagnosis not present

## 2020-01-24 DIAGNOSIS — M542 Cervicalgia: Secondary | ICD-10-CM | POA: Diagnosis not present

## 2020-01-24 MED ORDER — MELOXICAM 7.5 MG PO TABS
7.5000 mg | ORAL_TABLET | Freq: Every day | ORAL | 0 refills | Status: DC | PRN
Start: 2020-01-24 — End: 2020-02-19

## 2020-01-24 MED ORDER — METHOCARBAMOL 500 MG PO TABS: 500.0000 mg | ORAL_TABLET | Freq: Four times a day (QID) | ORAL | 0 refills | Status: DC | PRN

## 2020-01-24 NOTE — Telephone Encounter (Signed)
Patient came into office and dropped off forms for FMLA. Pt unaware of stop date or when PT will resume, and if needed to continue till end date. Placed in folder in Dr.Duncans inbox.
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
## 2020-01-24 NOTE — Patient Instructions (Signed)
Refer to PT.  Check with them to make sure PT doesn't worsen your concussion symptoms.  I would stay out of work until your headaches are better.  Take meloxicam with food.  Stop ibuprofen.  Use methocarbamol as needed.  Take care.  Glad to see you.

## 2020-01-25 DIAGNOSIS — S060X9A Concussion with loss of consciousness of unspecified duration, initial encounter: Secondary | ICD-10-CM | POA: Insufficient documentation

## 2020-01-25 DIAGNOSIS — M542 Cervicalgia: Secondary | ICD-10-CM | POA: Insufficient documentation

## 2020-01-25 DIAGNOSIS — S060XAA Concussion with loss of consciousness status unknown, initial encounter: Secondary | ICD-10-CM | POA: Insufficient documentation

## 2020-01-25 NOTE — Assessment & Plan Note (Signed)
Outside imaging discussed with patient. Still okay for outpatient follow-up. Concussion pathophysiology and protocol discussed with patient. I think it makes sense for her to be out of work until her concussive symptoms are better. I think she can go back when she not having headaches and she not having trouble with concentration, photophobia, etc. She agrees to plan. She can update me and drop off FMLA papers if needed.  At least 30 minutes were devoted to patient care in this encounter (this can potentially include time spent reviewing the patient's file/history, interviewing and examining the patient, counseling/reviewing plan with patient, ordering referrals, ordering tests, reviewing relevant laboratory or x-ray data, and documenting the encounter).
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
## 2020-01-26 ENCOUNTER — Other Ambulatory Visit: Payer: Self-pay | Admitting: Family Medicine

## 2020-01-26 MED ORDER — CYCLOBENZAPRINE HCL 10 MG PO TABS
5.0000 mg | ORAL_TABLET | Freq: Three times a day (TID) | ORAL | 0 refills | Status: DC | PRN
Start: 2020-01-26 — End: 2020-07-09

## 2020-01-26 NOTE — Telephone Encounter (Signed)
Called patient to inform forms done and ready for pick up. Unable to reach. Messaged via mychart.  Copy for patient Copy for scan

## 2020-01-26 NOTE — Progress Notes (Unsigned)
Notify pt.  Note came in from pharmacy.  Methocarbamol not covered, insurance may cover cyclobenzaprine which is another muscle relaxer.  rx sent for that.  Sedation caution.  Please update me as needed.  Also I did her FMLA papers.  Thanks.

## 2020-01-27 ENCOUNTER — Telehealth: Payer: Self-pay

## 2020-01-27 NOTE — Telephone Encounter (Signed)
Patient left FMLA forms for the doctor Para March to complete. Charge form is attached and form is on cart in the front

## 2020-01-29 ENCOUNTER — Encounter: Payer: Self-pay | Admitting: Family Medicine

## 2020-01-29 NOTE — Telephone Encounter (Signed)
I will work on the hardcopy.  Thanks. 

## 2020-01-30 NOTE — Telephone Encounter (Signed)
Patient states the following: (via mychart message) I am feeling better now and I feel like I can return to work. I spoke with HR at my job and they would like a note stating it is safe for me to return. I also dropped off the FMLA papers Friday for some corrections, and provided the fax number of HR. I told HR they would be faxed over. I can pick up the FMLA forms and the return to work note when they are completed. Thanks so much!  Patient is requesting a return to work note as well. Please advise.  Thank you, Catherine Shah

## 2020-01-31 ENCOUNTER — Encounter: Payer: Self-pay | Admitting: Family Medicine

## 2020-01-31 NOTE — Progress Notes (Signed)
Message sent thru MyChart 

## 2020-02-06 NOTE — Telephone Encounter (Signed)
Called patient to inform her paperwork ready for pickup. Unable to reach patient. LVM. When patient calls back please inform that paperwork has been faxed and copy ready up front.  Copy for scan Copy for patient

## 2020-02-17 ENCOUNTER — Other Ambulatory Visit: Payer: Self-pay | Admitting: Family Medicine

## 2020-02-17 NOTE — Telephone Encounter (Signed)
Please advise 

## 2020-02-19 NOTE — Telephone Encounter (Signed)
Sent.  Please get update on patient. Thanks.

## 2020-02-27 ENCOUNTER — Ambulatory Visit: Payer: BC Managed Care – PPO | Attending: Family Medicine

## 2020-03-01 ENCOUNTER — Ambulatory Visit: Payer: BC Managed Care – PPO

## 2020-03-07 ENCOUNTER — Ambulatory Visit: Payer: BC Managed Care – PPO

## 2020-03-21 ENCOUNTER — Telehealth: Payer: BC Managed Care – PPO | Admitting: Nurse Practitioner

## 2020-03-21 DIAGNOSIS — J0100 Acute maxillary sinusitis, unspecified: Secondary | ICD-10-CM

## 2020-03-21 MED ORDER — AMOXICILLIN-POT CLAVULANATE 875-125 MG PO TABS: 1.0000 | ORAL_TABLET | Freq: Two times a day (BID) | ORAL | 0 refills | Status: DC

## 2020-03-21 NOTE — Progress Notes (Signed)

## 2020-06-26 ENCOUNTER — Telehealth: Payer: Self-pay

## 2020-06-26 NOTE — Telephone Encounter (Signed)
Harryette-gean Heward Key: [id number] - Rx #: [id number] Need help? Call us at [phone] Status Sent to [facility] Etonogestrel-Ethinyl Estradiol 0.12-0.015MG /24HR rings Form [employer] Keys Commercial Electronic Request Form (CB) Original Claim Info 70,75 [place] PRIOR AUTH REQUIRED FOR GENERIC,BRAND PREFERREDPRODUCT OR SERVICE NOT COVERED. PLEASECONTACT PRESCRIBER

## 2020-06-29 ENCOUNTER — Telehealth: Payer: Self-pay

## 2020-06-29 NOTE — Telephone Encounter (Signed)
Happy to see for discussion and letter if appropriate

## 2020-06-29 NOTE — Telephone Encounter (Signed)
Catherine Shah pt she plans to stay at [facility]... pt is needing letter for Bariatric surgery... Would you be willing to see pt for letter as she does not have a current PCP?

## 2020-07-02 ENCOUNTER — Telehealth: Payer: Self-pay | Admitting: Family Medicine

## 2020-07-02 NOTE — Telephone Encounter (Signed)
Catherine Shah Key: [id number] - Rx #: [id number] Need help? Call us at [phone] Outcome Deniedon April 23 Drug Etonogestrel-Ethinyl Estradiol 0.12-0.015MG /24HR rings Form [employer] account executive Form (CB) Original Claim Info 70,75 [place] PRIOR AUTH REQUIRED FOR GENERIC,BRAND PREFERREDPRODUCT OR SERVICE NOT COVERED. PLEASECONTACT PRESCRIBER

## 2020-07-02 NOTE — Telephone Encounter (Signed)
Pt called in and stated that her birth control the Nuvaring was denied and the insurance stated that the Dr needs to call and medical necessity explaining that this birth control is the safest and she has tried all the other ones.    Please advise

## 2020-07-03 NOTE — Telephone Encounter (Signed)
Ileene Rubens, RN, was working on this PA so forwarding this msg to her.

## 2020-07-03 NOTE — Telephone Encounter (Signed)
Pt scheduled for appt with Para March on 5/2

## 2020-07-09 ENCOUNTER — Other Ambulatory Visit: Payer: Self-pay

## 2020-07-09 ENCOUNTER — Encounter: Payer: Self-pay | Admitting: Family Medicine

## 2020-07-09 ENCOUNTER — Ambulatory Visit: Payer: BC Managed Care – PPO | Admitting: Family Medicine

## 2020-07-09 VITALS — BP 120/82 | HR 91 | Temp 97.4°F | Ht 63.0 in | Wt 253.0 lb

## 2020-07-09 DIAGNOSIS — Z6841 Body Mass Index (BMI) 40.0 and over, adult: Secondary | ICD-10-CM

## 2020-07-09 NOTE — Telephone Encounter (Signed)
Patient was in office today and states PA still needs to be done for her Nuvaring. States she has tried depo shots, pills and the patches in the past.

## 2020-07-09 NOTE — Patient Instructions (Signed)
Thank you for your effort.  I'll work on Medical illustrator and we'll update CCS.   Take care.  Glad to see you.

## 2020-07-09 NOTE — Progress Notes (Signed)
This visit occurred during the SARS-CoV-2 public health emergency.  Safety protocols were in place, including screening questions prior to the visit, additional usage of staff PPE, and extensive cleaning of exam room while observing appropriate contact time as indicated for disinfecting solutions.  Preop discussion regarding obesity.  Considering bariatric intervention. States she has tried depo shots, pills and the patches in the past.  She isn't a candidate for patch, had weight gain with depo, and had difficulty with use of OCPs.  She needs nuvaring PA done.   She is working through the process for bariatric surgery.  No h/o MI, CVA, CHF.  No h/o DVT.  No CP, not SOB.  Some occ L ankle edema.  No h/o abd surgery.    She is working out with Systems analyst and going through a diet program.    GEN: nad, alert and oriented HEENT: NCAT. NECK: supple w/o LA CV: rrr. PULM: ctab, no inc wob ABD: soft, +bs EXT: no edema SKIN: no acute rash 30 minutes were devoted to patient care in this encounter (this includes time spent reviewing the patient's file/history, interviewing and examining the patient, counseling/reviewing plan with patient).

## 2020-07-10 NOTE — Telephone Encounter (Signed)
Appeal completed and given to Syliva Overman, RN for Worthy Rancher, FNP signature.

## 2020-07-10 NOTE — Telephone Encounter (Signed)
Called and spoke with [employer] who stated they are faxing over appeal form to be completed. Awaiting fax.

## 2020-07-11 NOTE — Assessment & Plan Note (Signed)
It appears that she would be a reasonable candidate for bariatric intervention.  She is to continue work on diet and exercise.  General perioperative risk discussion done.  I will work on a letter for her.  I thanked her for her effort.

## 2020-07-13 NOTE — Telephone Encounter (Signed)
Received signed PA form today from Worthy Rancher and given to [place], RN to complete process and send back to the company for processing.

## 2020-07-16 NOTE — Telephone Encounter (Signed)
Signed appeal form faxed to [facility]. Awaiting response.

## 2020-07-30 NOTE — Telephone Encounter (Addendum)
Fax concerning appeal requesting additional information.  Refaxed appeal form and last office note from 07/09/20 to same fax number as previously. Placed all in folder on Anisha's desk.

## 2020-08-23 DIAGNOSIS — Z01818 Encounter for other preprocedural examination: Secondary | ICD-10-CM | POA: Diagnosis not present

## 2020-08-23 DIAGNOSIS — F32A Depression, unspecified: Secondary | ICD-10-CM | POA: Diagnosis not present

## 2020-08-31 ENCOUNTER — Encounter: Payer: Self-pay | Admitting: Physician Assistant

## 2020-08-31 ENCOUNTER — Telehealth: Payer: BC Managed Care – PPO | Admitting: Emergency Medicine

## 2020-08-31 ENCOUNTER — Encounter: Payer: Self-pay | Admitting: Family Medicine

## 2020-08-31 ENCOUNTER — Telehealth: Payer: BC Managed Care – PPO | Admitting: Physician Assistant

## 2020-08-31 DIAGNOSIS — U071 COVID-19: Secondary | ICD-10-CM

## 2020-08-31 MED ORDER — BENZONATATE 100 MG PO CAPS: 100.0000 mg | ORAL_CAPSULE | Freq: Three times a day (TID) | ORAL | 0 refills | Status: DC | PRN

## 2020-08-31 MED ORDER — FLUTICASONE PROPIONATE 50 MCG/ACT NA SUSP: 2.0000 | Freq: Every day | NASAL | 0 refills | Status: DC

## 2020-08-31 NOTE — Progress Notes (Signed)
Based on the information that you have shared in the e-Visit Questionnaire, we recommend that you convert this visit to a video visit in order for the provider to better assess what is going on.  The provider will be able to give you a more accurate diagnosis and treatment plan if we can more freely discuss your symptoms and with the addition of a virtual examination.   If you convert to a video visit, we will bill your insurance (similar to an office visit) and you will not be charged for this e-Visit. You will be able to stay at home and speak with the first available [facility] advanced practice provider. The link to do a video visit is in the drop down Menu tab of your Welcome screen in MyChart.  Approximately 5 minutes was spent documenting and reviewing patient's chart.  

## 2020-08-31 NOTE — Patient Instructions (Signed)
1. COVID-19  - flonase for congestion  - tessalon for cough  - tylenol and ibuprofen for fever, sore throat, body aches, headache  - rest, drink plenty of fluids  - to ER for worsening symptoms

## 2020-08-31 NOTE — Telephone Encounter (Signed)
Mrs. Catherine Shah called in wanted to know about why she hasnt heard anything about the PA for her birth control. She stated that the nurse only sent in 1 sentence about why she needs to take it. She is about to file a complaint.

## 2020-08-31 NOTE — Progress Notes (Signed)
Catherine Shah,you are scheduled for a virtual visit with your provider today.    Just as we do with appointments in the office, we must obtain your consent to participate.  Your consent will be active for this visit and any virtual visit you may have with one of our providers in the next 365 days.    If you have a MyChart account, I can also send a copy of this consent to you electronically.  All virtual visits are billed to your insurance company just like a traditional visit in the office.  As this is a virtual visit, video technology does not allow for your provider to perform a traditional examination.  This may limit your provider's ability to fully assess your condition.  If your provider identifies any concerns that need to be evaluated in person or the need to arrange testing such as labs, EKG, etc, we will make arrangements to do so.    Although advances in technology are sophisticated, we cannot ensure that it will always work on either your end or our end.  If the connection with a video visit is poor, we may have to switch to a telephone visit.  With either a video or telephone visit, we are not always able to ensure that we have a secure connection.   I need to obtain your verbal consent now.   Are you willing to proceed with your visit today?   Catherine Shah has provided verbal consent on 08/31/2020 for a virtual visit (video or telephone).   Catherine Forth, PA-C 08/31/2020  6:31 PM   Date:  08/31/2020   ID:  Catherine Shah, DOB [birth year], MRN [id number]  Patient Location: Home Provider Location: Home Office   Participants: Patient and Provider for Visit and Wrap up  Method of visit: Video  Location of Patient: Home Location of Provider: Home Office Consent was obtain for visit over the video. Services rendered by provider: Visit was performed via video  A video enabled telemedicine application was used and I verified that I am speaking with the correct person using two  identifiers.  PCP:  Emi Belfast, FNP   Chief Complaint:  COVID +  History of Present Illness:    Catherine Shah is a 35 y.o. female with history as stated below. Presents video telehealth for an acute care visit  Onset of symptoms was 08/27/2020 and symptoms have been persistent and include: fever, cough, congestion, sore throat, myalgias  Modifying factors include: nyquil and dayquil with moderate relief.  Pt's sister is sick with the same.  No other aggravating or relieving factors.  No other c/o.  Pt takes Gabapentin, Celexa and Wellbutrin, but denies other medical problems  The patient does have symptoms concerning for COVID-19 infection (fever, chills, cough, or new shortness of breath).  Patient has been tested for COVID during this illness - result: positive  Past Medical, Surgical, Social History, Allergies, and Medications have been Reviewed.  Patient Active Problem List   Diagnosis Date Noted   Neck pain 01/25/2020   Concussion 01/25/2020   Vitamin D deficiency 10/28/2019   Chronic pain of both knees 11/06/2018   Morbid obesity with body mass index (BMI) of 40.0 to 44.9 in adult [facility]) 11/06/2018   Thrombosed external hemorrhoids 01/05/2018   Palpitations 07/12/2015   Dizziness 07/12/2015   Anxiety 07/12/2015   History of hypertension 07/12/2015   Systolic murmur 07/12/2015   Epilepsy (HCC) 02/27/2015   Insomnia 02/27/2015   Acid reflux 02/27/2015  Social History   Tobacco Use   Smoking status: Never   Smokeless tobacco: Never  Substance Use Topics   Alcohol use: Yes    Alcohol/week: 2.0 standard drinks    Types: 2 Standard drinks or equivalent per week     Current Outpatient Medications:    benzonatate (TESSALON PERLES) 100 MG capsule, Take 1 capsule (100 mg total) by mouth 3 (three) times daily as needed for cough (cough)., Disp: 20 capsule, Rfl: 0   fluticasone (FLONASE) 50 MCG/ACT nasal spray, Place 2 sprays into both nostrils daily., Disp: 9.9  g, Rfl: 0   buPROPion (WELLBUTRIN SR) 100 MG 12 hr tablet, Take 100 mg by mouth 2 (two) times daily. , Disp: , Rfl:    citalopram (CELEXA) 40 MG tablet, Take 40 mg by mouth daily., Disp: , Rfl:    etonogestrel-ethinyl estradiol (NUVARING) 0.12-0.015 MG/24HR vaginal ring, Place 1 each vaginally every 28 (twenty-eight) days. Insert vaginally and leave in place for 3 consecutive weeks, then remove for 1 week., Disp: 3 each, Rfl: 4   gabapentin (NEURONTIN) 100 MG capsule, Take 100 mg by mouth 3 (three) times daily as needed., Disp: , Rfl:    gabapentin (NEURONTIN) 800 MG tablet, Take 800 mg by mouth at bedtime. , Disp: , Rfl:    Allergies  Allergen Reactions   Tape Other (See Comments)    sensitivity     Review of Systems  Constitutional:  Positive for fever. Negative for chills.  HENT:  Positive for congestion and sore throat. Negative for ear pain.   Eyes:  Negative for blurred vision and double vision.  Respiratory:  Positive for cough. Negative for shortness of breath and wheezing.   Cardiovascular:  Negative for chest pain, palpitations and leg swelling.  Gastrointestinal:  Negative for abdominal pain, diarrhea, nausea and vomiting.  Genitourinary:  Negative for dysuria.  Musculoskeletal:  Positive for myalgias.  Skin:  Negative for rash.  Neurological:  Positive for headaches. Negative for loss of consciousness and weakness.  Psychiatric/Behavioral:  The patient is not nervous/anxious.   See HPI for history of present illness.  Physical Exam Constitutional:      General: She is not in acute distress.    Appearance: Normal appearance. She is not ill-appearing.  HENT:     Head: Normocephalic and atraumatic.     Nose: Congestion present.  Eyes:     Extraocular Movements: Extraocular movements intact.  Pulmonary:     Effort: Pulmonary effort is normal.     Comments: Speaks in full sentences. Dry cough Musculoskeletal:        General: Normal range of motion.     Cervical back:  Normal range of motion.  Skin:    Coloration: Skin is not pale.  Neurological:     General: No focal deficit present.     Mental Status: She is alert. Mental status is at baseline.  Psychiatric:        Mood and Affect: Mood normal.              A&P  1. COVID-19  - flonase for congestion  - tessalon for cough  - tylenol and ibuprofen for fever, sore throat, body aches, headache  - rest, drink plenty of fluids  - to ER for worsening symptoms  Discussed risk and benefit of Molnupiravir.  Pt is day #4.  She does not wish to start the antiviral at this time.   Patient voiced understanding and agreement to plan.   Time:  Today, I have spent 15 minutes with the patient with telehealth technology discussing the above problems, reviewing the chart, previous notes, medications and orders.    Tests Ordered: No orders of the defined types were placed in this encounter.   Medication Changes: Meds ordered this encounter  Medications   benzonatate (TESSALON PERLES) 100 MG capsule    Sig: Take 1 capsule (100 mg total) by mouth 3 (three) times daily as needed for cough (cough).    Dispense:  20 capsule    Refill:  0   fluticasone (FLONASE) 50 MCG/ACT nasal spray    Sig: Place 2 sprays into both nostrils daily.    Dispense:  9.9 g    Refill:  0     Disposition:  Follow up in ED for worsening symptoms.  Yetta Barre, PA-C  08/31/2020 6:31 PM

## 2020-09-03 ENCOUNTER — Encounter: Payer: Self-pay | Admitting: Family Medicine

## 2020-09-03 NOTE — Telephone Encounter (Signed)
I printed the letter.  Please let me know if there is anything else I need to do.  Thanks.

## 2020-09-03 NOTE — Telephone Encounter (Signed)
I contacted, Shirlean Mylar. And another rep from [employer] to review the current appeal.  Misty Stanley was able to confirm the following:   Appeal [phone] is in progress and was submitted as a (2nd attempt) as first level 1 attempt was considered (invalid) due to consent form not received. 2nd attempt processed on 08/29/20 and is under review company states they have 20 days (until 7/17) to make a decision.   2.  Medical records were faxed over per Sherrie George, RN at the end of May and were made a part of this review.    3.  I, also, faxed the updated OV note from the 07/09/20 OV with Dr. Para March as well as a copy of the patient letter with medical hx outlining need for the Nuva ring.  This was faxed today (6/27) to [phone] to the appeals department attn and to add this to the note review process for CASE ID:  [id number].  I contacted patient to apologize for the delays in communication regarding this need. I informed her all that occurred today and the update regarding the appeal, which is we have to give them until 09/23/20.  Patient verbalized understanding but stated she just felt like we were refusing to give her information and she didn't like that.  I reviewed her concerns and explained that her note sent over on 6/8 was forwarded to a nurse to follow up on with the insurance company and that I would investigate the break down that occurred.  I reassured her that we would never want a patient to feel that we were avoiding them.  In addition, she wanted to know what exactly had been faxed to the insurance company.  I informed her of the visit note that I sent along with the note that [facility] sent (which was her annual exam from 10/2019).  At her request, we did copy the letter she sent under our [place] letter section (please see letter) for copy of what Dr. Para March sent on behalf of the patient.  Patient is aware that she can call and speak to me if she has any further questions or concerns.  At this point, all we can  do is watch and wait for decision outcome.  Patient thanks me for the call and to Dr. Para March for all he is doing to help her.

## 2020-09-03 NOTE — Telephone Encounter (Signed)
After looking deeper into her chart; per 07/02/20 message in chart Anisha and Carollee Herter were taking care of this and the last message was sent to [facility] to check on so I'm not sure where things where left there.

## 2020-09-03 NOTE — Telephone Encounter (Signed)
I am working on this as patient has escalated a complaint through me.  I will be reaching out to [facility] today to find out what is going on.    Please discuss with me before contacting patient as I will assume the lead on this at the moment.   Thanks.

## 2020-09-05 ENCOUNTER — Telehealth: Payer: BC Managed Care – PPO | Admitting: Physician Assistant

## 2020-09-05 DIAGNOSIS — B9689 Other specified bacterial agents as the cause of diseases classified elsewhere: Secondary | ICD-10-CM | POA: Diagnosis not present

## 2020-09-05 DIAGNOSIS — J019 Acute sinusitis, unspecified: Secondary | ICD-10-CM | POA: Diagnosis not present

## 2020-09-05 MED ORDER — AMOXICILLIN-POT CLAVULANATE 875-125 MG PO TABS: 1.0000 | ORAL_TABLET | Freq: Two times a day (BID) | ORAL | 0 refills | Status: DC

## 2020-09-05 NOTE — Progress Notes (Signed)
I have spent 5 minutes in review of e-visit questionnaire, review and updating patient chart, medical decision making and response to patient.   Deshay Kirstein Cody Talecia Sherlin, PA-C    

## 2020-09-05 NOTE — Progress Notes (Signed)

## 2020-09-05 NOTE — Telephone Encounter (Signed)
Please refer to documentation regarding my discussion with patient on 09/03/20 addressing her concerns. (Documented in the MyChart Message she sent that day in addition to the phone note).

## 2020-09-17 ENCOUNTER — Other Ambulatory Visit (HOSPITAL_COMMUNITY)
Admission: RE | Admit: 2020-09-17 | Discharge: 2020-09-17 | Disposition: A | Discharge status: Home / Self Care | Payer: BC Managed Care – PPO | Source: Ambulatory Visit | Attending: Nurse Practitioner | Admitting: Nurse Practitioner

## 2020-09-17 ENCOUNTER — Other Ambulatory Visit: Payer: Self-pay

## 2020-09-17 ENCOUNTER — Encounter: Payer: Self-pay | Admitting: Nurse Practitioner

## 2020-09-17 ENCOUNTER — Telehealth: Payer: Self-pay | Admitting: *Deleted

## 2020-09-17 ENCOUNTER — Ambulatory Visit (INDEPENDENT_AMBULATORY_CARE_PROVIDER_SITE_OTHER): Payer: BC Managed Care – PPO | Admitting: Nurse Practitioner

## 2020-09-17 VITALS — BP 130/74 | Ht 63.5 in | Wt 254.0 lb

## 2020-09-17 DIAGNOSIS — Z3044 Encounter for surveillance of vaginal ring hormonal contraceptive device: Secondary | ICD-10-CM

## 2020-09-17 DIAGNOSIS — Z01419 Encounter for gynecological examination (general) (routine) without abnormal findings: Secondary | ICD-10-CM

## 2020-09-17 DIAGNOSIS — Z113 Encounter for screening for infections with a predominantly sexual mode of transmission: Secondary | ICD-10-CM | POA: Diagnosis not present

## 2020-09-17 MED ORDER — ETONOGESTREL-ETHINYL ESTRADIOL 0.12-0.015 MG/24HR VA RING: 1.0000 | VAGINAL_RING | VAGINAL | 4 refills | Status: DC

## 2020-09-17 NOTE — Progress Notes (Signed)
   Kaylise Siever [birth year] [id number]   History:  35 y.o. G0 presents as new patient to establish care. No GYN complaints. Monthly cycles/Nuvaring. 10/2017 normal cytology positive HPV, 10/2018 normal negative HPV. Has received Gardasil series. In the process of scheduling bariatric surgery, will likely be done later this year. Sexually active with multiple partners. History of vitamin D deficiency, hypertension, heart murmur.   Gynecologic History Patient's last menstrual period was 09/10/2020 (exact date).   Contraception/Family planning: NuvaRing vaginal inserts  Health Maintenance Last Pap: . 10/2018 Results were: Normal Last mammogram: Not indicated  Last colonoscopy: Not indicated Last Dexa: Not indicated  Past medical history, past surgical history, family history and social history were all reviewed and documented in the EPIC chart. Single. Hospital liaison.   ROS:  A ROS was performed and pertinent positives and negatives are included.  Exam:  Vitals:   09/17/20 0851  BP: 130/74  Weight: 254 lb (115.2 kg)  Height: 5' 3.5" (1.613 m)   Body mass index is 44.29 kg/m.  General appearance:  Normal Thyroid:  Symmetrical, normal in size, without palpable masses or nodularity. Respiratory  Auscultation:  Clear without wheezing or rhonchi Cardiovascular  Auscultation:  Regular rate, without rubs, murmurs or gallops  Edema/varicosities:  Not grossly evident Abdominal  Soft,nontender, without masses, guarding or rebound.  Liver/spleen:  No organomegaly noted  Hernia:  None appreciated  Skin  Inspection:  Grossly normal Breasts: Examined lying and sitting.   Right: Without masses, retractions, nipple discharge or axillary adenopathy.   Left: Without masses, retractions, nipple discharge or axillary adenopathy. Genitourinary   Inguinal/mons:  Normal without inguinal adenopathy  External genitalia:  Normal appearing vulva with no masses, tenderness, or  lesions  BUS/Urethra/Skene's glands:  Normal  Vagina:  Normal appearing with normal color and discharge, no lesions. Medication ring present.  Cervix:  Normal appearing without discharge or lesions  Uterus:  Difficult to palpate due to body habitus but no gross masses or tenderness  Adnexa/parametria:     Rt: Normal in size, without masses or tenderness.   Lt: Normal in size, without masses or tenderness.  Anus and perineum: Normal  Patient informed chaperone available to be present for breast and pelvic exam. Patient has requested no chaperone to be present. Patient has been advised what will be completed during breast and pelvic exam.   Assessment/Plan:  35 y.o. G0 for annual exam.   Well female exam with routine gynecological exam - Education provided on SBEs, importance of preventative screenings, current guidelines, high calcium diet, regular exercise, and multivitamin daily. Labs done recently for bariatric surgery workup.   Screen for STD (sexually transmitted disease) - Plan: Cytology - PAP( [facility]). Gonorrhea, chlamydia, trich added to pap.   Encounter for surveillance of vaginal ring hormonal contraceptive device - Plan: etonogestrel-ethinyl estradiol (NUVARING) 0.12-0.015 MG/24HR vaginal ring. Using as prescribed. Refill x 1 year provided.   Screening for cervical cancer - 10/2017 normal cytology positive HPV, 10/2018 normal negative HPV. Pap with HPV today.    Olivia Mackie DNP, 9:08 AM 09/17/2020

## 2020-09-17 NOTE — Telephone Encounter (Signed)
PA done via cover my meds for generic nuvaring. I left message for patient to call to see if she tried brand nuvaring before, [employer] is wanting to know to with the PA.

## 2020-09-18 ENCOUNTER — Encounter: Payer: Self-pay | Admitting: Nurse Practitioner

## 2020-09-18 NOTE — Telephone Encounter (Signed)
Patient called back stating insurance did not cove the brand nuvaring , this is why she was switched to generic nuvaring. She does not remember having any issues with brand ring.

## 2020-09-19 MED ORDER — NUVARING 0.12-0.015 MG/24HR VA RING: VAGINAL_RING | VAGINAL | 4 refills | Status: DC

## 2020-09-19 NOTE — Telephone Encounter (Signed)
Patient called back asking if I could send in brand nuvaring, Rx sent.

## 2020-09-21 LAB — CYTOLOGY - PAP
Adequacy: ABSENT
Chlamydia: NEGATIVE
Comment: NEGATIVE
Comment: NEGATIVE
Comment: NEGATIVE
Comment: NORMAL
Diagnosis: NEGATIVE
High risk HPV: NEGATIVE
Neisseria Gonorrhea: NEGATIVE
Trichomonas: NEGATIVE

## 2020-09-22 ENCOUNTER — Other Ambulatory Visit: Payer: Self-pay

## 2020-09-22 ENCOUNTER — Encounter (HOSPITAL_COMMUNITY): Payer: Self-pay

## 2020-09-22 ENCOUNTER — Ambulatory Visit (HOSPITAL_COMMUNITY)
Admission: RE | Admit: 2020-09-22 | Discharge: 2020-09-22 | Disposition: A | Discharge status: Home / Self Care | Payer: BC Managed Care – PPO | Source: Ambulatory Visit | Attending: Student | Admitting: Student

## 2020-09-22 VITALS — BP 123/81 | HR 85 | Temp 98.1°F | Resp 17

## 2020-09-22 DIAGNOSIS — H6593 Unspecified nonsuppurative otitis media, bilateral: Secondary | ICD-10-CM

## 2020-09-22 DIAGNOSIS — Z8616 Personal history of COVID-19: Secondary | ICD-10-CM | POA: Diagnosis not present

## 2020-09-22 MED ORDER — PREDNISONE 20 MG PO TABS: 40.0000 mg | ORAL_TABLET | Freq: Every day | ORAL | 0 refills | Status: AC

## 2020-09-22 NOTE — ED Provider Notes (Signed)
[facility]    CSN: [id number] Arrival date & time: 09/22/20  1200      History   Chief Complaint Chief Complaint  Patient presents with   Appointment   Otalgia    HPI Catherine Shah is a 35 y.o. female presenting with nasal congestion and bilateral ear pressure x1 month following COVID19. Was treated with amoxicillin for sinusitis, finished this 1 week ago.  With minimal improvement in the ear pressure.  Muffled hearing, but denies tinnitus, dizziness.  Denies facial pressure, fever/chills, new cough.   HPI  Past Medical History:  Diagnosis Date   Arrhythmia    Generalized anxiety disorder    HPV (human papilloma virus) infection    Major depression    Panic attacks    Seizure (HCC)    last in 2013   STD (sexually transmitted disease)    hx of HPV and Chlamydia    Patient Active Problem List   Diagnosis Date Noted   Neck pain 01/25/2020   Concussion 01/25/2020   Vitamin D deficiency 10/28/2019   Chronic pain of both knees 11/06/2018   Morbid obesity with body mass index (BMI) of 40.0 to 44.9 in adult [facility]) 11/06/2018   Thrombosed external hemorrhoids 01/05/2018   Palpitations 07/12/2015   Dizziness 07/12/2015   Anxiety 07/12/2015   History of hypertension 07/12/2015   Systolic murmur 07/12/2015   Epilepsy (HCC) 02/27/2015   Insomnia 02/27/2015   Acid reflux 02/27/2015    Past Surgical History:  Procedure Laterality Date   TONSILLECTOMY AND ADENOIDECTOMY  1994   TOOTH EXTRACTION  age 53    OB History     Gravida  0   Para  0   Term  0   Preterm  0   AB  0   Living  0      SAB  0   IAB  0   Ectopic  0   Multiple  0   Live Births  0            Home Medications    Prior to Admission medications   Medication Sig Start Date End Date Taking? Authorizing Provider  predniSONE (DELTASONE) 20 MG tablet Take 2 tablets (40 mg total) by mouth daily for 5 days. 09/22/20 09/27/20 Yes Rhys Martini, PA-C  buPROPion [facility])  100 MG 12 hr tablet Take 100 mg by mouth 2 (two) times daily.  08/25/19   [provider]  citalopram (CELEXA) 40 MG tablet Take 40 mg by mouth daily.    [provider]  gabapentin (NEURONTIN) 100 MG capsule Take 100 mg by mouth 3 (three) times daily as needed.    [provider]  gabapentin (NEURONTIN) 800 MG tablet Take 800 mg by mouth at bedtime.     [provider]  NUVARING 0.12-0.015 MG/24HR vaginal ring Insert vaginally and leave in place for 3 consecutive weeks, then remove for 1 week. 09/19/20   Olivia Mackie, NP    Family History Family History  Problem Relation Age of Onset   Hypertension Mother    Depression Mother    Diabetes Mother    Stroke Mother    Cancer Father        Bile duct cancer   Hypertension Maternal Grandmother    Depression Maternal Grandmother    Hypertension Maternal Grandfather    Coronary artery disease Other        [facility]   Cancer Other  [facility]   Stroke Other        [facility]   Lung disease Other        [facility]    Social History Social History   Tobacco Use   Smoking status: Never   Smokeless tobacco: Never  Vaping Use   Vaping Use: Never used  Substance Use Topics   Alcohol use: Yes    Alcohol/week: 2.0 standard drinks    Types: 2 Standard drinks or equivalent per week   Drug use: No     Allergies   Tape   Review of Systems Review of Systems  Constitutional:  Negative for appetite change, chills and fever.  HENT:  Positive for congestion and ear pain. Negative for rhinorrhea, sinus pressure, sinus pain and sore throat.   Eyes:  Negative for redness and visual disturbance.  Respiratory:  Negative for cough, chest tightness, shortness of breath and wheezing.   Cardiovascular:  Negative for chest pain and palpitations.  Gastrointestinal:  Negative for abdominal pain, constipation, diarrhea, nausea and vomiting.  Genitourinary:  Negative for dysuria, frequency and urgency.  Musculoskeletal:  Negative  for myalgias.  Neurological:  Negative for dizziness, weakness and headaches.  Psychiatric/Behavioral:  Negative for confusion.   All other systems reviewed and are negative.   Physical Exam Triage Vital Signs ED Triage Vitals  Enc Vitals Group     BP 09/22/20 1222 123/81     Pulse Rate 09/22/20 1222 85     Resp 09/22/20 1222 17     Temp 09/22/20 1222 98.1 F (36.7 C)     Temp Source 09/22/20 1222 Oral     SpO2 09/22/20 1222 96 %     Weight --      Height --      Head Circumference --      Peak Flow --      Pain Score 09/22/20 1223 0     Pain Loc --      Pain Edu? --      Excl. in GC? --    No data found.  Updated Vital Signs BP 123/81 (BP Location: Right Arm)   Pulse 85   Temp 98.1 F (36.7 C) (Oral)   Resp 17   LMP 09/10/2020 (Exact Date)   SpO2 96%   Visual Acuity Right Eye Distance:   Left Eye Distance:   Bilateral Distance:    Right Eye Near:   Left Eye Near:    Bilateral Near:     Physical Exam Vitals reviewed.  Constitutional:      General: She is not in acute distress.    Appearance: Normal appearance. She is not ill-appearing.  HENT:     Head: Normocephalic and atraumatic.     Right Ear: Hearing, ear canal and external ear normal. No swelling or tenderness. A middle ear effusion is present. There is no impacted cerumen. No mastoid tenderness. Tympanic membrane is not perforated, erythematous, retracted or bulging.     Left Ear: Hearing, ear canal and external ear normal. No swelling or tenderness. A middle ear effusion is present. There is no impacted cerumen. No mastoid tenderness. Tympanic membrane is not perforated, erythematous, retracted or bulging.     Ears:     Comments: No infection    Nose:     Right Sinus: No maxillary sinus tenderness or frontal sinus tenderness.     Left Sinus: No maxillary sinus tenderness or frontal sinus tenderness.     Mouth/Throat:     Mouth: Mucous  membranes are moist.     Pharynx: Uvula midline. No  oropharyngeal exudate or posterior oropharyngeal erythema.     Tonsils: No tonsillar exudate.  Cardiovascular:     Rate and Rhythm: Normal rate and regular rhythm.     Heart sounds: Normal heart sounds.  Pulmonary:     Breath sounds: Normal breath sounds and air entry. No wheezing, rhonchi or rales.  Chest:     Chest wall: No tenderness.  Abdominal:     General: Abdomen is flat. Bowel sounds are normal.     Tenderness: There is no abdominal tenderness. There is no guarding or rebound.  Lymphadenopathy:     Cervical: No cervical adenopathy.  Neurological:     General: No focal deficit present.     Mental Status: She is alert and oriented to person, place, and time.  Psychiatric:        Attention and Perception: Attention and perception normal.        Mood and Affect: Mood and affect normal.        Behavior: Behavior normal. Behavior is cooperative.        Thought Content: Thought content normal.        Judgment: Judgment normal.     UC Treatments / Results  Labs (all labs ordered are listed, but only abnormal results are displayed) Labs Reviewed - No data to display  EKG   Radiology No results found.  Procedures Procedures (including critical care time)  Medications Ordered in UC Medications - No data to display  Initial Impression / Assessment and Plan / UC Course  I have reviewed the triage vital signs and the nursing notes.  Pertinent labs & imaging results that were available during my care of the patient were reviewed by me and considered in my medical decision making (see chart for details).     This patient is a very pleasant 35 y.o. year old female presenting with mid ear effusion. Afebrile, nontachy.  Hx COVID19 1 month ago, and finished course of amoxicillin 1 week ago for sinusitis. No infection present today, will try short course of prednisone as below.  Suspect allergic rhinitis component, also recommended trial of Zyrtec. ED return precautions  discussed. Patient verbalizes understanding and agreement.    Final Clinical Impressions(s) / UC Diagnoses   Final diagnoses:  Fluid level behind tympanic membrane of both ears  History of COVID-19     Discharge Instructions      -Prednisone, 2 pills taken at the same time for 5 days in a row.  Try taking this earlier in the day as it can give you energy. Avoid NSAIDs like ibuprofen and alleve while taking this medication as they can increase your risk of stomach upset and even GI bleeding when in combination with a steroid. You can continue tylenol (acetaminophen) up to 1000mg  3x daily.      ED Prescriptions     Medication Sig Dispense Auth. Provider   predniSONE (DELTASONE) 20 MG tablet Take 2 tablets (40 mg total) by mouth daily for 5 days. 10 tablet , PA-C      PDMP not reviewed this encounter.   Rhys Martini, PA-C 09/22/20 1318

## 2020-09-22 NOTE — ED Triage Notes (Signed)
Pt presents with nasal congestion and ear pain X 1 month.   States she had covid last month and the sxs have not improved.

## 2020-09-22 NOTE — Discharge Instructions (Addendum)
-  Prednisone, 2 pills taken at the same time for 5 days in a row.  Try taking this earlier in the day as it can give you energy. Avoid NSAIDs like ibuprofen and alleve while taking this medication as they can increase your risk of stomach upset and even GI bleeding when in combination with a steroid. You can continue tylenol (acetaminophen) up to 1000mg  3x daily.

## 2020-09-23 ENCOUNTER — Other Ambulatory Visit: Payer: Self-pay | Admitting: Nurse Practitioner

## 2020-09-23 DIAGNOSIS — B9689 Other specified bacterial agents as the cause of diseases classified elsewhere: Secondary | ICD-10-CM

## 2020-09-23 DIAGNOSIS — B3731 Acute candidiasis of vulva and vagina: Secondary | ICD-10-CM

## 2020-09-23 DIAGNOSIS — B373 Candidiasis of vulva and vagina: Secondary | ICD-10-CM

## 2020-09-23 MED ORDER — METRONIDAZOLE 500 MG PO TABS: 500.0000 mg | ORAL_TABLET | Freq: Two times a day (BID) | ORAL | 0 refills | Status: DC

## 2020-09-23 MED ORDER — FLUCONAZOLE 150 MG PO TABS: 150.0000 mg | ORAL_TABLET | Freq: Once | ORAL | 0 refills | Status: AC

## 2020-09-25 ENCOUNTER — Telehealth: Payer: BC Managed Care – PPO | Admitting: Family Medicine

## 2020-09-28 ENCOUNTER — Telehealth: Payer: BC Managed Care – PPO | Admitting: Physician Assistant

## 2020-09-28 DIAGNOSIS — R0981 Nasal congestion: Secondary | ICD-10-CM | POA: Diagnosis not present

## 2020-09-28 MED ORDER — PREDNISONE 10 MG (21) PO TBPK: ORAL_TABLET | ORAL | 0 refills | Status: DC

## 2020-09-28 MED ORDER — IPRATROPIUM BROMIDE 0.03 % NA SOLN: 2.0000 | Freq: Two times a day (BID) | NASAL | 0 refills | Status: DC

## 2020-09-28 MED ORDER — FLUTICASONE PROPIONATE 50 MCG/ACT NA SUSP: 2.0000 | Freq: Every day | NASAL | 0 refills | Status: DC

## 2020-09-28 NOTE — Progress Notes (Signed)
E-Visit for Sinus Problems  We are sorry that you are not feeling well.  Here is how we plan to help!  Based on what you have shared with me it looks like you have sinusitis.  Sinusitis is inflammation and infection in the sinus cavities of the head.  Based on your presentation I believe you most likely have Acute Viral Sinusitis.This is an infection most likely caused by a virus. There is not specific treatment for viral sinusitis other than to help you with the symptoms until the infection runs its course.  You may use an oral decongestant such as Mucinex D or if you have glaucoma or high blood pressure use plain Mucinex. Saline nasal spray help and can safely be used as often as needed for congestion, I have prescribed: Fluticasone nasal spray two sprays in each nostril once a day and Ipratropium Bromide nasal spray 0.03% 2 sprays in eah nostril 2-3 times a day  Some authorities believe that zinc sprays or the use of Echinacea may shorten the course of your symptoms.  Sinus infections are not as easily transmitted as other respiratory infection, however we still recommend that you avoid close contact with loved ones, especially the very young and elderly.  Remember to wash your hands thoroughly throughout the day as this is the number one way to prevent the spread of infection!  Home Care: Only take medications as instructed by your medical team. Do not take these medications with alcohol. A steam or ultrasonic humidifier can help congestion.  You can place a towel over your head and breathe in the steam from hot water coming from a faucet. Avoid close contacts especially the very young and the elderly. Cover your mouth when you cough or sneeze. Always remember to wash your hands.  Get Help Right Away If: You develop worsening fever or sinus pain. You develop a severe head ache or visual changes. Your symptoms persist after you have completed your treatment plan.  Make sure you Understand  these instructions. Will watch your condition. Will get help right away if you are not doing well or get worse.   Thank you for choosing an e-visit.  Your e-visit answers were reviewed by a board certified advanced clinical practitioner to complete your personal care plan. Depending upon the condition, your plan could have included both over the counter or prescription medications.  Please review your pharmacy choice. Make sure the pharmacy is open so you can pick up prescription now. If there is a problem, you may contact your provider through MyChart messaging and have the prescription routed to another pharmacy.  Your safety is important to us. If you have drug allergies check your prescription carefully.   For the next 24 hours you can use MyChart to ask questions about today's visit, request a non-urgent call back, or ask for a work or school excuse. You will get an email in the next two days asking about your experience. I hope that your e-visit has been valuable and will speed your recovery.  I provided 6 minutes of non face-to-face time during this encounter for chart review and documentation.   

## 2020-09-28 NOTE — Addendum Note (Signed)
Addended by: Margaretann Loveless on: 09/28/2020 07:45 AM   Modules accepted: Orders

## 2020-10-03 ENCOUNTER — Encounter: Payer: Self-pay | Admitting: Skilled Nursing Facility1

## 2020-10-03 ENCOUNTER — Encounter: Payer: BC Managed Care – PPO | Attending: General Surgery | Admitting: Skilled Nursing Facility1

## 2020-10-03 ENCOUNTER — Other Ambulatory Visit: Payer: Self-pay

## 2020-10-03 ENCOUNTER — Ambulatory Visit: Payer: BC Managed Care – PPO | Admitting: Skilled Nursing Facility1

## 2020-10-03 DIAGNOSIS — Z6841 Body Mass Index (BMI) 40.0 and over, adult: Secondary | ICD-10-CM | POA: Insufficient documentation

## 2020-10-03 NOTE — Progress Notes (Addendum)
Nutrition Assessment for Bariatric Surgery Medical Nutrition Therapy Appt Start Time: 9:51    End Time: 10:45  Patient was seen on 10/03/2020 for Pre-Operative Nutrition Assessment. Letter of approval faxed to [facility] bariatric surgery program coordinator on 10/03/2020.   Referral stated Supervised Weight Loss (SWL) visits needed: 0  Pt completed visits.   Pt has cleared nutrition requirements.   Planned surgery: sleeve gastrectomy  Pt expectation of surgery: to lose weight  Pt expectation of dietitian: to collaborate     NUTRITION ASSESSMENT   Anthropometrics  Start weight at [facility]: 254.6 lbs (date: 10/03/2020)  Height: 64 in BMI: 43.7 kg/m2     Clinical  Medical hx: depression, anxiety, seizure last being about 10 years ago Medications: see list  Labs: A1C 5.0, vitamin D 21.65 Notable signs/symptoms: knee pain Any previous deficiencies? 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   Pt states she does eat past the point of fullness throughout the week but does not feel she loses control over the amount of food she eats.  Pt states she is going to actvitly work on stopping when she is satisfied rather than eating past fullness. Pt states she is about start working with a talk therapist.   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    NUTRITION INTERVENTION  Nutrition counseling (C-1) and education (E-2) to facilitate bariatric surgery goals.  Educated pt on micronutrient deficiencies post surgery and strategies to mitigate that risk   Pre-Op Goals Reviewed with the Patient Track food and beverage intake (pen and paper, MyFitness Pal, Baritastic app, etc.) Make healthy food choices while monitoring portion sizes Consume 3 meals per day or try to eat every 3-5 hours Avoid concentrated sugars and fried foods Keep sugar & fat in the single digits per serving on food labels Practice CHEWING your food (aim for applesauce consistency) Practice not drinking 15 minutes before, during, and 30 minutes after each meal and snack Avoid all carbonated beverages (ex: soda, sparkling beverages)  Limit caffeinated beverages (ex: coffee, tea, energy drinks) Avoid all sugar-sweetened beverages (ex: regular soda, sports drinks)  Avoid alcohol  Aim for 64-100 ounces of FLUID daily (with at least half of fluid intake being plain water)  Aim for at least 60-80 grams of PROTEIN daily Look for a liquid protein source that contains ?15 g protein and ?5 g carbohydrate (ex: shakes, drinks, shots) Make a list of non-food related activities Physical activity is an important part of a healthy lifestyle so keep it moving! The goal is to reach 150 minutes of exercise per week, including cardiovascular and weight baring activity.  *Goals that are bolded indicate the pt would like to start working towards these  Handouts Provided Include  Bariatric Surgery handouts (Nutrition Visits, Pre-Op Goals, Protein Shakes, Vitamins & Minerals)  Learning Style & Readiness for Change Teaching method utilized: Visual & Auditory  Demonstrated degree  of understanding via: Teach Back  Readiness Level: contemplative  Barriers to learning/adherence to lifestyle change: none identified       MONITORING & EVALUATION Dietary intake, weekly physical activity, body weight, and pre-op goals reached at next nutrition visit.    Next Steps  Patient is to follow up at [facility] for Pre-Op Class >2 weeks before surgery for further nutrition education.  Pt has completed visits. No further supervised visits required

## 2020-10-04 ENCOUNTER — Ambulatory Visit: Payer: Self-pay | Admitting: *Deleted

## 2020-10-04 NOTE — Telephone Encounter (Signed)
Reason for Disposition  New or worsened shortness of breath with activity (dyspnea on exertion)  Answer Assessment - Initial Assessment Questions 1. DESCRIPTION: "Please describe your heart rate or heartbeat that you are having" (e.g., fast/slow, regular/irregular, skipped or extra beats, "palpitations")     Constant, irregular 2. ONSET: "When did it start?" (Minutes, hours or days)      2 days ago 3. DURATION: "How long does it last" (e.g., seconds, minutes, hours) Constant   4. PATTERN "Does it come and go, or has it been constant since it started?"  "Does it get worse with exertion?"   "Are you feeling it now?"     Steady irregular 5. TAP: "Using your hand, can you tap out what you are feeling on a chair or table in front of you, so that I can hear?" (Note: not all patients can do this)       no 6. HEART RATE: "Can you tell me your heart rate?" "How many beats in 15 seconds?"  (Note: not all patients can do this)       no 7. RECURRENT SYMPTOM: "Have you ever had this before?" If Yes, ask: "When was the last time?" and "What happened that time?"      Slight murmur years ago 8. CAUSE: "What do you think is causing the palpitations?"     Unsure 9. CARDIAC HISTORY: "Do you have any history of heart disease?" (e.g., heart attack, angina, bypass surgery, angioplasty, arrhythmia)      Years ago murmur 10. OTHER SYMPTOMS: "Do you have any other symptoms?" (e.g., dizziness, chest pain, sweating, difficulty breathing)      Chest pain at sternum, tightness all over,  Protocols used: Heart Rate and Heartbeat Questions-A-AH

## 2020-10-04 NOTE — Telephone Encounter (Signed)
Pt reports steady, irregular heart beat. "Palpitations but constant. Denies dizziness. Reports chest "Sore" at sternum, chest tightness "All over." States tested positive covid June 23rd, "Never really went away." Reports congestion, cough. Did virtual tele- visit 09/28/20 "Sinusitis"  Just completed prednisone taper. States second round of steroids, completed antibiotic course as well. Advised ED for irregular HR, states she will go to UC. Advised may be sent to ED. Pt verbalizes understanding.

## 2020-10-05 ENCOUNTER — Ambulatory Visit (HOSPITAL_COMMUNITY)
Admission: RE | Admit: 2020-10-05 | Discharge: 2020-10-05 | Disposition: A | Discharge status: Home / Self Care | Payer: BC Managed Care – PPO | Source: Ambulatory Visit | Attending: Internal Medicine | Admitting: Internal Medicine

## 2020-10-05 ENCOUNTER — Encounter (HOSPITAL_COMMUNITY): Payer: Self-pay

## 2020-10-05 ENCOUNTER — Other Ambulatory Visit: Payer: Self-pay

## 2020-10-05 VITALS — BP 134/93 | HR 97 | Temp 99.0°F | Resp 18

## 2020-10-05 DIAGNOSIS — R0981 Nasal congestion: Secondary | ICD-10-CM | POA: Diagnosis not present

## 2020-10-05 DIAGNOSIS — H938X3 Other specified disorders of ear, bilateral: Secondary | ICD-10-CM

## 2020-10-05 DIAGNOSIS — R0789 Other chest pain: Secondary | ICD-10-CM

## 2020-10-05 DIAGNOSIS — R002 Palpitations: Secondary | ICD-10-CM | POA: Diagnosis not present

## 2020-10-05 MED ORDER — DEXAMETHASONE SODIUM PHOSPHATE 10 MG/ML IJ SOLN
INTRAMUSCULAR | Status: AC
  Filled 2020-10-05: qty 1

## 2020-10-05 MED ORDER — ALBUTEROL SULFATE HFA 108 (90 BASE) MCG/ACT IN AERS
INHALATION_SPRAY | RESPIRATORY_TRACT | Status: AC
  Filled 2020-10-05: qty 6.7

## 2020-10-05 MED ORDER — ALBUTEROL SULFATE HFA 108 (90 BASE) MCG/ACT IN AERS
2.0000 | INHALATION_SPRAY | Freq: Once | RESPIRATORY_TRACT | Status: AC
  Administered 2020-10-05: 2 via RESPIRATORY_TRACT

## 2020-10-05 MED ORDER — DEXAMETHASONE SODIUM PHOSPHATE 10 MG/ML IJ SOLN
10.0000 mg | Freq: Once | INTRAMUSCULAR | Status: AC
  Administered 2020-10-05: 10 mg via INTRAMUSCULAR

## 2020-10-05 NOTE — ED Provider Notes (Signed)
[facility]    CSN: [id number] Arrival date & time: 10/05/20  1640      History   Chief Complaint Chief Complaint  Patient presents with   Appointment   Shortness of Breath   Palpitations   Ear Fullness    HPI Catherine Shah is a 35 y.o. female.   Patient presenting today with over a month of ongoing chest tightness, mild shortness of breath with activity, intermittent short bursts of palpitations, bilateral ear pain and pressure, congestion.  Tested positive for COVID on 08/30/2020, has been symptomatic ever since.  Has been on 2 rounds of antibiotics and steroids with no improvement.  Using nasal sprays daily with no significant benefit.  Denies fevers, chills, chest pain, dizziness, weakness, syncope.    Past Medical History:  Diagnosis Date   Arrhythmia    Generalized anxiety disorder    HPV (human papilloma virus) infection    Major depression    Panic attacks    Seizure (HCC)    last in 2013   STD (sexually transmitted disease)    hx of HPV and Chlamydia    Patient Active Problem List   Diagnosis Date Noted   Neck pain 01/25/2020   Concussion 01/25/2020   Vitamin D deficiency 10/28/2019   Chronic pain of both knees 11/06/2018   Morbid obesity with body mass index (BMI) of 40.0 to 44.9 in adult [facility]) 11/06/2018   Thrombosed external hemorrhoids 01/05/2018   Palpitations 07/12/2015   Dizziness 07/12/2015   Anxiety 07/12/2015   History of hypertension 07/12/2015   Systolic murmur 07/12/2015   Epilepsy (HCC) 02/27/2015   Insomnia 02/27/2015   Acid reflux 02/27/2015    Past Surgical History:  Procedure Laterality Date   TONSILLECTOMY AND ADENOIDECTOMY  1994   TOOTH EXTRACTION  age 82    OB History     Gravida  0   Para  0   Term  0   Preterm  0   AB  0   Living  0      SAB  0   IAB  0   Ectopic  0   Multiple  0   Live Births  0            Home Medications    Prior to Admission medications   Medication Sig Start  Date End Date Taking? Authorizing Provider  buPROPion (WELLBUTRIN SR) 100 MG 12 hr tablet Take 100 mg by mouth 2 (two) times daily.  08/25/19   [provider]  citalopram (CELEXA) 40 MG tablet Take 40 mg by mouth daily.    [provider]  fluticasone (FLONASE) 50 MCG/ACT nasal spray Place 2 sprays into both nostrils daily. 09/28/20   Margaretann Loveless, PA-C  gabapentin (NEURONTIN) 100 MG capsule Take 100 mg by mouth 3 (three) times daily as needed.    [provider]  gabapentin (NEURONTIN) 800 MG tablet Take 800 mg by mouth at bedtime.     [provider]  ipratropium (ATROVENT) 0.03 % nasal spray Place 2 sprays into both nostrils every 12 (twelve) hours. 09/28/20   Margaretann Loveless, PA-C  metroNIDAZOLE (FLAGYL) 500 MG tablet Take 1 tablet (500 mg total) by mouth 2 (two) times daily. 09/23/20   Olivia Mackie, NP  NUVARING 0.12-0.015 MG/24HR vaginal ring Insert vaginally and leave in place for 3 consecutive weeks, then remove for 1 week. 09/19/20   Olivia Mackie, NP  predniSONE (STERAPRED UNI-PAK 21 TAB) 10 MG (  21) TBPK tablet 6 day taper; take as directed on package instructions 09/28/20   Margaretann Loveless, PA-C    Family History Family History  Problem Relation Age of Onset   Hypertension Mother    Depression Mother    Diabetes Mother    Stroke Mother    Cancer Father        Bile duct cancer   Hypertension Maternal Grandmother    Depression Maternal Grandmother    Hypertension Maternal Grandfather    Coronary artery disease Other        [facility]   Cancer Other        [facility]   Stroke Other        [facility]   Lung disease Other        [facility]    Social History Social History   Tobacco Use   Smoking status: Never   Smokeless tobacco: Never  Vaping Use   Vaping Use: Never used  Substance Use Topics   Alcohol use: Yes    Alcohol/week: 2.0 standard drinks    Types: 2 Standard drinks or equivalent per week   Drug use: No     Allergies    Tape   Review of Systems Review of Systems Per HPI  Physical Exam Triage Vital Signs ED Triage Vitals  Enc Vitals Group     BP 10/05/20 1708 (!) 134/93     Pulse Rate 10/05/20 1708 97     Resp 10/05/20 1708 18     Temp 10/05/20 1708 99 F (37.2 C)     Temp Source 10/05/20 1708 Oral     SpO2 10/05/20 1708 97 %     Weight --      Height --      Head Circumference --      Peak Flow --      Pain Score 10/05/20 1705 0     Pain Loc --      Pain Edu? --      Excl. in GC? --    No data found.  Updated Vital Signs BP (!) 134/93 (BP Location: Right Arm)   Pulse 97   Temp 99 F (37.2 C) (Oral)   Resp 18   LMP 09/10/2020 (Exact Date)   SpO2 97%   Visual Acuity Right Eye Distance:   Left Eye Distance:   Bilateral Distance:    Right Eye Near:   Left Eye Near:    Bilateral Near:     Physical Exam Vitals and nursing note reviewed.  Constitutional:      Appearance: Normal appearance. She is not ill-appearing.  HENT:     Head: Atraumatic.     Ears:     Comments: Bilateral middle ear effusions    Nose:     Comments: Bilateral nasal mucosa erythematous and edematous    Mouth/Throat:     Mouth: Mucous membranes are moist.     Pharynx: Posterior oropharyngeal erythema present. No oropharyngeal exudate.  Eyes:     Extraocular Movements: Extraocular movements intact.     Conjunctiva/sclera: Conjunctivae normal.  Cardiovascular:     Rate and Rhythm: Normal rate and regular rhythm.     Heart sounds: Normal heart sounds.  Pulmonary:     Effort: Pulmonary effort is normal.     Breath sounds: Normal breath sounds. No wheezing or rales.  Abdominal:     General: Bowel sounds are normal. There is no distension.     Palpations: Abdomen is soft.  Tenderness: There is no abdominal tenderness. There is no guarding.  Musculoskeletal:        General: Normal range of motion.     Cervical back: Normal range of motion and neck supple.  Skin:    General: Skin is warm and dry.   Neurological:     Mental Status: She is alert and oriented to person, place, and time.  Psychiatric:        Mood and Affect: Mood normal.        Thought Content: Thought content normal.        Judgment: Judgment normal.     UC Treatments / Results  Labs (all labs ordered are listed, but only abnormal results are displayed) Labs Reviewed - No data to display  EKG   Radiology No results found.  Procedures Procedures (including critical care time)  Medications Ordered in UC Medications  albuterol (VENTOLIN HFA) 108 (90 Base) MCG/ACT inhaler 2 puff (2 puffs Inhalation Given 10/05/20 1806)  dexamethasone (DECADRON) injection 10 mg (10 mg Intramuscular Given 10/05/20 1807)    Initial Impression / Assessment and Plan / UC Course  I have reviewed the triage vital signs and the nursing notes.  Pertinent labs & imaging results that were available during my care of the patient were reviewed by me and considered in my medical decision making (see chart for details).     Vital signs and exam very reassuring, EKG normal sinus rhythm with no acute ST or T wave changes.  Suspect some long COVID changes, persistent inflammatory symptoms.  No evidence of a bacterial infection today.  We will treat with IM Decadron, albuterol inhaler for as needed use.  Continue nasal sprays, Sudafed as needed, supportive home care.  Return for reevaluation if worsening.  Final Clinical Impressions(s) / UC Diagnoses   Final diagnoses:  Palpitations  Chest tightness  Nasal congestion  Pressure sensation in both ears   Discharge Instructions   None    ED Prescriptions   None    PDMP not reviewed this encounter.   Particia Nearing, [place] 10/05/20 1846

## 2020-10-05 NOTE — ED Triage Notes (Signed)
Pt presents with SOB, heart palpitations that started this week, and fluid in ears. States tested positive for COVID 6/23 and symptoms have been on going since. States completed round of antibiotics and two rounds of steroids with no improvement. States has not had any heart palpitations today.

## 2020-10-10 ENCOUNTER — Telehealth: Payer: BC Managed Care – PPO | Admitting: Physician Assistant

## 2020-10-10 DIAGNOSIS — F331 Major depressive disorder, recurrent, moderate: Secondary | ICD-10-CM | POA: Diagnosis not present

## 2020-10-10 DIAGNOSIS — R0602 Shortness of breath: Secondary | ICD-10-CM

## 2020-10-10 DIAGNOSIS — F411 Generalized anxiety disorder: Secondary | ICD-10-CM | POA: Diagnosis not present

## 2020-10-10 DIAGNOSIS — F4323 Adjustment disorder with mixed anxiety and depressed mood: Secondary | ICD-10-CM | POA: Diagnosis not present

## 2020-10-11 NOTE — Progress Notes (Signed)
Based on what you shared with me, I feel your condition warrants further evaluation and I recommend that you be seen in a face to face visit.  I am concerned about your ongoing symptoms despite appropriate intervention with medications. I am especially worried about your shortness of breath with exertion. I would recommend that you be seen at either urgent care or the emergency department. You may need to have a chest xray completed and may need further workup depending on the provider's discretion.   NOTE: There will be NO CHARGE for this eVisit   If you are having a true medical emergency please call 911.      For an urgent face to face visit, [place] has six urgent care centers for your convenience:     [facility] at [facility] Directions [phone] [address] [place], [place] [address]    [facility] Health [facility]) Get Driving Directions [phone] [address] [place], [place] [address]  [facility]) Get Driving Directions [phone] [address] [place],  [place]  [address]  [facility] Health Urgent Care at [facility] Get Driving Directions [phone] [address], [address] [place], [place] [address]   [facility] Health Urgent Care at [facility] Get Driving Directions  [phone] [address] [place], [place] [address]   [facility] Health Urgent Care at [facility] Directions [phone] [address] [place], [place] [address]  Your MyChart E-visit questionnaire answers were reviewed by a board certified advanced clinical practitioner to complete your personal care plan based on your specific symptoms.  Thank you for using e-Visits.   Approximately 5 minutes was spent documenting and reviewing patient's chart.

## 2020-10-18 ENCOUNTER — Ambulatory Visit: Payer: BC Managed Care – PPO | Admitting: Family Medicine

## 2020-10-18 ENCOUNTER — Encounter: Payer: Self-pay | Admitting: Family Medicine

## 2020-10-18 ENCOUNTER — Other Ambulatory Visit: Payer: Self-pay

## 2020-10-18 VITALS — BP 122/84 | HR 108 | Temp 98.3°F | Ht 64.0 in | Wt 253.0 lb

## 2020-10-18 DIAGNOSIS — R0981 Nasal congestion: Secondary | ICD-10-CM | POA: Diagnosis not present

## 2020-10-18 MED ORDER — CLARITIN-D 12 HOUR 5-120 MG PO TB12: 1.0000 | ORAL_TABLET | Freq: Every day | ORAL | 1 refills | Status: DC

## 2020-10-18 NOTE — Patient Instructions (Signed)
Don't change your regular meds.  Add on claritin D and we'll call about seeing [facility].  Take care.  Glad to see you.

## 2020-10-18 NOTE — Progress Notes (Signed)
This visit occurred during the SARS-CoV-2 public health emergency.  Safety protocols were in place, including screening questions prior to the visit, additional usage of staff PPE, and extensive cleaning of exam room while observing appropriate contact time as indicated for disinfecting solutions.  Ear pain.  She had covid in 08/2020.  Still with nasal congestion but not much rhinorrhea.  Feels like she has fluid in B ears.  No fevers.  No vomiting.  No ST.  She can get TM movement on valsalva.  Done with amoxil and prednisone.  Already using flonase and nasal atrovent.    Meds, vitals, and allergies reviewed.   ROS: Per HPI unless specifically indicated in ROS section   Nad Ncat She has TM movement B with Valsalva. Scant fluid behind R TM.  TM wnl B o/w.  No tympanic membrane erythema bilaterally. Nasal congestion noted B.  Op wnl. MMM Neck supple, no LA Rrr Ctab Skin well perfused.

## 2020-10-19 DIAGNOSIS — R0981 Nasal congestion: Secondary | ICD-10-CM | POA: Insufficient documentation

## 2020-10-19 NOTE — Assessment & Plan Note (Signed)
Discussed options.  She is already taking oral steroids and is already using Flonase and nasal Atrovent. Add on claritin D and we'll call about seeing [facility].  She agrees with plan.

## 2020-10-20 ENCOUNTER — Other Ambulatory Visit: Payer: Self-pay | Admitting: Physician Assistant

## 2020-10-20 DIAGNOSIS — R0981 Nasal congestion: Secondary | ICD-10-CM

## 2020-10-27 ENCOUNTER — Ambulatory Visit (HOSPITAL_COMMUNITY): Payer: BC Managed Care – PPO

## 2020-10-29 DIAGNOSIS — F4323 Adjustment disorder with mixed anxiety and depressed mood: Secondary | ICD-10-CM | POA: Diagnosis not present

## 2020-11-07 ENCOUNTER — Ambulatory Visit: Payer: BC Managed Care – PPO | Admitting: Skilled Nursing Facility1

## 2020-11-07 DIAGNOSIS — F411 Generalized anxiety disorder: Secondary | ICD-10-CM | POA: Diagnosis not present

## 2020-11-07 DIAGNOSIS — F4323 Adjustment disorder with mixed anxiety and depressed mood: Secondary | ICD-10-CM | POA: Diagnosis not present

## 2020-11-07 DIAGNOSIS — F331 Major depressive disorder, recurrent, moderate: Secondary | ICD-10-CM | POA: Diagnosis not present

## 2020-11-21 DIAGNOSIS — F331 Major depressive disorder, recurrent, moderate: Secondary | ICD-10-CM | POA: Diagnosis not present

## 2020-11-22 ENCOUNTER — Encounter: Payer: Self-pay | Admitting: Otolaryngology

## 2020-11-22 DIAGNOSIS — H6983 Other specified disorders of Eustachian tube, bilateral: Secondary | ICD-10-CM | POA: Diagnosis not present

## 2020-11-22 DIAGNOSIS — J310 Chronic rhinitis: Secondary | ICD-10-CM | POA: Diagnosis not present

## 2020-12-26 DIAGNOSIS — F331 Major depressive disorder, recurrent, moderate: Secondary | ICD-10-CM | POA: Diagnosis not present

## 2021-01-04 DIAGNOSIS — F331 Major depressive disorder, recurrent, moderate: Secondary | ICD-10-CM | POA: Diagnosis not present

## 2021-01-07 DIAGNOSIS — F331 Major depressive disorder, recurrent, moderate: Secondary | ICD-10-CM | POA: Diagnosis not present

## 2021-01-07 DIAGNOSIS — F411 Generalized anxiety disorder: Secondary | ICD-10-CM | POA: Diagnosis not present

## 2021-01-07 DIAGNOSIS — F4323 Adjustment disorder with mixed anxiety and depressed mood: Secondary | ICD-10-CM | POA: Diagnosis not present

## 2021-01-11 DIAGNOSIS — F331 Major depressive disorder, recurrent, moderate: Secondary | ICD-10-CM | POA: Diagnosis not present

## 2021-01-18 DIAGNOSIS — F331 Major depressive disorder, recurrent, moderate: Secondary | ICD-10-CM | POA: Diagnosis not present

## 2021-01-30 DIAGNOSIS — F331 Major depressive disorder, recurrent, moderate: Secondary | ICD-10-CM | POA: Diagnosis not present

## 2021-02-01 DIAGNOSIS — Z20822 Contact with and (suspected) exposure to covid-19: Secondary | ICD-10-CM | POA: Diagnosis not present

## 2021-02-02 ENCOUNTER — Telehealth: Payer: BC Managed Care – PPO | Admitting: Nurse Practitioner

## 2021-02-02 DIAGNOSIS — U071 COVID-19: Secondary | ICD-10-CM

## 2021-02-02 MED ORDER — MOLNUPIRAVIR EUA 200MG CAPSULE: 4.0000 | ORAL_CAPSULE | Freq: Two times a day (BID) | ORAL | 0 refills | Status: AC

## 2021-02-02 NOTE — Progress Notes (Signed)
I have spent 5 minutes in review of e-visit questionnaire, review and updating patient chart, medical decision making and response to patient.  ° °Catherine Shah Dejanira Pamintuan, NP ° °  °

## 2021-02-02 NOTE — Progress Notes (Signed)
E-Visit  for Positive Covid Test Result  We are sorry you are not feeling well. We are here to help!  You have tested positive for COVID-19, meaning that you were infected with the novel coronavirus and could give the virus to others.  It is vitally important that you stay home so you do not spread it to others.      Please continue isolation at home, for at least 10 days since the start of your symptoms and until you have had 24 hours with no fever (without taking a fever reducer) and with improving of symptoms.  If you have no symptoms but tested positive (or all symptoms resolve after 5 days and you have no fever) you can leave your house but continue to wear a mask around others for an additional 5 days. If you have a fever,continue to stay home until you have had 24 hours of no fever. Most cases improve 5-10 days from onset but we have seen a small number of patients who have gotten worse after the 10 days.  Please be sure to watch for worsening symptoms and remain taking the proper precautions.   Go to the nearest hospital ED for assessment if fever/cough/breathlessness are severe or illness seems like a threat to life.    The following symptoms may appear 2-14 days after exposure: Fever Cough Shortness of breath or difficulty breathing Chills Repeated shaking with chills Muscle pain Headache Sore throat New loss of taste or smell Fatigue Congestion or runny nose Nausea or vomiting Diarrhea  You have been enrolled in MyChart Home Monitoring for COVID-19. Daily you will receive a questionnaire within the MyChart website. Our COVID-19 response team will be monitoring your responses daily.  I have prescribed Molnupiravir for your current symptoms.   You may also take acetaminophen (Tylenol) as needed for fever.  HOME CARE: Only take medications as instructed by your medical team. Drink plenty of fluids and get plenty of rest. A steam or ultrasonic humidifier can help if you  have congestion.   GET HELP RIGHT AWAY IF YOU HAVE EMERGENCY WARNING SIGNS.  Call 911 or proceed to your closest emergency facility if: You develop worsening high fever. Trouble breathing Bluish lips or face Persistent pain or pressure in the chest New confusion Inability to wake or stay awake You cough up blood. Your symptoms become more severe Inability to hold down food or fluids  This list is not all possible symptoms. Contact your medical provider for any symptoms that are severe or concerning to you.    Your e-visit answers were reviewed by a board certified advanced clinical practitioner to complete your personal care plan.  Depending on the condition, your plan could have included both over the counter or prescription medications.  If there is a problem please reply once you have received a response from your provider.  Your safety is important to [place].  If you have drug allergies check your prescription carefully.    You can use MyChart to ask questions about today's visit, request a non-urgent call back, or ask for a work or school excuse for 24 hours related to this e-Visit. If it has been greater than 24 hours you will need to follow up with your provider, or enter a new e-Visit to address those concerns. You will get an e-mail in the next two days asking about your experience.  I hope that your e-visit has been valuable and will speed your recovery. Thank you for  using e-visits.

## 2021-02-11 DIAGNOSIS — F331 Major depressive disorder, recurrent, moderate: Secondary | ICD-10-CM | POA: Diagnosis not present

## 2021-02-13 DIAGNOSIS — F331 Major depressive disorder, recurrent, moderate: Secondary | ICD-10-CM | POA: Diagnosis not present

## 2021-02-13 DIAGNOSIS — F4323 Adjustment disorder with mixed anxiety and depressed mood: Secondary | ICD-10-CM | POA: Diagnosis not present

## 2021-02-13 DIAGNOSIS — F411 Generalized anxiety disorder: Secondary | ICD-10-CM | POA: Diagnosis not present

## 2021-02-15 DIAGNOSIS — F331 Major depressive disorder, recurrent, moderate: Secondary | ICD-10-CM | POA: Diagnosis not present

## 2021-02-22 DIAGNOSIS — F331 Major depressive disorder, recurrent, moderate: Secondary | ICD-10-CM | POA: Diagnosis not present

## 2021-02-28 DIAGNOSIS — F331 Major depressive disorder, recurrent, moderate: Secondary | ICD-10-CM | POA: Diagnosis not present

## 2021-03-07 ENCOUNTER — Telehealth: Payer: BC Managed Care – PPO | Admitting: Physician Assistant

## 2021-03-07 DIAGNOSIS — B9689 Other specified bacterial agents as the cause of diseases classified elsewhere: Secondary | ICD-10-CM | POA: Diagnosis not present

## 2021-03-07 DIAGNOSIS — N941 Unspecified dyspareunia: Secondary | ICD-10-CM | POA: Diagnosis not present

## 2021-03-07 DIAGNOSIS — N760 Acute vaginitis: Secondary | ICD-10-CM | POA: Diagnosis not present

## 2021-03-07 MED ORDER — METRONIDAZOLE 500 MG PO TABS: 500.0000 mg | ORAL_TABLET | Freq: Two times a day (BID) | ORAL | 0 refills | Status: DC

## 2021-03-07 NOTE — Progress Notes (Signed)
I have spent 5 minutes in review of e-visit questionnaire, review and updating patient chart, medical decision making and response to patient.   Alic Hilburn Cody Dorn Hartshorne, PA-C    

## 2021-03-07 NOTE — Progress Notes (Addendum)
E-Visit for Vaginal Symptoms  We are sorry that you are not feeling well. Here is how we plan to help! Based on what you shared with me it looks like you: May have a vaginosis due to bacteria  Vaginosis is an inflammation of the vagina that can result in discharge, itching and pain. The cause is usually a change in the normal balance of vaginal bacteria or an infection. Vaginosis can also result from reduced estrogen levels after menopause.  The most common causes of vaginosis are:   Bacterial vaginosis which results from an overgrowth of one on several organisms that are normally present in your vagina.   Yeast infections which are caused by a naturally occurring fungus called candida.   Vaginal atrophy (atrophic vaginosis) which results from the thinning of the vagina from reduced estrogen levels after menopause.   Trichomoniasis which is caused by a parasite and is commonly transmitted by sexual intercourse.  Factors that increase your risk of developing vaginosis include: Medications, such as antibiotics and steroids Uncontrolled diabetes Use of hygiene products such as bubble bath, vaginal spray or vaginal deodorant Douching Wearing damp or tight-fitting clothing Using an intrauterine device (IUD) for birth control Hormonal changes, such as those associated with pregnancy, birth control pills or menopause Sexual activity Having a sexually transmitted infection  Your treatment plan is Metronidazole or Flagyl 500mg  twice a day for 7 days.  I have electronically sent this prescription into the pharmacy that you have chosen.  If not resolving or anything worsens you must be seen in person with your PCP or Gynecologist as discussed.   Be sure to take all of the medication as directed. Stop taking any medication if you develop a rash, tongue swelling or shortness of breath. Mothers who are breast feeding should consider pumping and discarding their breast milk while on these  antibiotics. However, there is no consensus that infant exposure at these doses would be harmful.  Remember that medication creams can weaken latex condoms.   HOME CARE:  Good hygiene may prevent some types of vaginosis from recurring and may relieve some symptoms:  Avoid baths, hot tubs and whirlpool spas. Rinse soap from your outer genital area after a shower, and dry the area well to prevent irritation. Don't use scented or harsh soaps, such as those with deodorant or antibacterial action. Avoid irritants. These include scented tampons and pads. Wipe from front to back after using the toilet. Doing so avoids spreading fecal bacteria to your vagina.  Other things that may help prevent vaginosis include:  Don't douche. Your vagina doesn't require cleansing other than normal bathing. Repetitive douching disrupts the normal organisms that reside in the vagina and can actually increase your risk of vaginal infection. Douching won't clear up a vaginal infection. Use a latex condom. Both female and female latex condoms may help you avoid infections spread by sexual contact. Wear cotton underwear. Also wear pantyhose with a cotton crotch. If you feel comfortable without it, skip wearing underwear to bed. Yeast thrives in [place] Your symptoms should improve in the next day or two.  GET HELP RIGHT AWAY IF:  You have pain in your lower abdomen ( pelvic area or over your ovaries) You develop nausea or vomiting You develop a fever Your discharge changes or worsens You have persistent pain with intercourse You develop shortness of breath, a rapid pulse, or you faint.  These symptoms could be signs of problems or infections that need to be evaluated by  a medical provider now.  MAKE SURE YOU   Understand these instructions. Will watch your condition. Will get help right away if you are not doing well or get worse.  Thank you for choosing an e-visit.  Your e-visit answers were  reviewed by a board certified advanced clinical practitioner to complete your personal care plan. Depending upon the condition, your plan could have included both over the counter or prescription medications.  Please review your pharmacy choice. Make sure the pharmacy is open so you can pick up prescription now. If there is a problem, you may contact your provider through [employer] and have the prescription routed to another pharmacy.  Your safety is important to [place]. If you have drug allergies check your prescription carefully.   For the next 24 hours you can use MyChart to ask questions about today's visit, request a non-urgent call back, or ask for a work or school excuse. You will get an email in the next two days asking about your experience. I hope that your e-visit has been valuable and will speed your recovery.

## 2021-03-07 NOTE — Addendum Note (Signed)
Addended by: Waldon Merl on: 03/07/2021 08:27 AM   Modules accepted: Orders

## 2021-03-15 DIAGNOSIS — F331 Major depressive disorder, recurrent, moderate: Secondary | ICD-10-CM | POA: Diagnosis not present

## 2021-03-23 ENCOUNTER — Ambulatory Visit: Payer: BC Managed Care – PPO

## 2021-03-29 ENCOUNTER — Encounter: Payer: BC Managed Care – PPO | Attending: General Surgery | Admitting: Dietician

## 2021-03-29 ENCOUNTER — Other Ambulatory Visit: Payer: Self-pay

## 2021-03-29 DIAGNOSIS — Z6841 Body Mass Index (BMI) 40.0 and over, adult: Secondary | ICD-10-CM | POA: Insufficient documentation

## 2021-03-29 DIAGNOSIS — F331 Major depressive disorder, recurrent, moderate: Secondary | ICD-10-CM | POA: Diagnosis not present

## 2021-03-29 NOTE — Progress Notes (Signed)
Pre-Operative Nutrition Class:  Appt start time: 0900   End time:  1100.  Patient was seen on 03/29/21 for Pre-Operative Bariatric Surgery Education at [facility] at [facility].   Surgery date:  Surgery type: sleeve gastrectomy Start weight at [facility]: 254.6lbs Weight today: 257.1lbs  InBody  BODY COMP RESULTS    BMI (kg/m^2) 44.1  Fat Mass (lbs) 130.3  Dry Lean Mass (lbs) 33.3  Total Body Water (lbs) 93.5   Samples given per MNT protocol. Patient educated on appropriate usage:  Lot number Expiration date  Celebrate Vitamins Pack:  Calcium soft chews salt caramel choc   [id number]   06/2021  Calcium soft chews watermelon 2111 12/2021  Calcium soft chews straw-ban cream 2122 01/2022  Calcium soft chews cafe mocha 2117 12/2021  Calcium soft chews orange 2180 02/2022  Calcium soft chews pb chocolate [id number] 01/2022  Calcium soft chews caramel 2055 10/2021  Calcium soft chews chocolate 2082 12/2021  Calcium soft chews rasp lemonade [id number] 12/2021  Calcium soft chews lemon cream 2159 02/2022  Calcium plus 500 tab cherry tart [phone] 08/2021      Iron soft chews 2m cherry burst 269678L301/2024  Iron soft chews 630mtwisted citrus 2281017P14/2024  Iron 45 tab grape [phone] 04/2022  B 12 quick melt 11025-85271/2023      Multivitamin soft chews very cherry 21011B6   Multivitamin soft chews strawberry 21011A6              Unjury products:                   Unflavored powder  [id number] 06/2022  Chicken soup powder  [id number]   06/2022  1/2 serv Moonbeam Mist  [id number] 01/2023      The following the learning objectives were met by the patient during this course: Identify Pre-Op Dietary Goals and will begin 2 weeks pre-operatively Identify appropriate sources of fluids and proteins  State protein recommendations and appropriate sources pre and post-operatively Identify Post-Operative Dietary Goals and will follow for 2 weeks  post-operatively Identify appropriate multivitamin and calcium sources Describe the need for physical activity post-operatively and will follow MD recommendations State when to call healthcare provider regarding medication questions or post-operative complications  Handouts given during class include: Pre-Op Bariatric Surgery Diet Handout Protein Shake Handout Post-Op Bariatric Surgery Nutrition Handout BELT Program Information Flyer Support Group Information Flyer [facility] Bariatric Supplements Price List  Follow[place]: [place] for diet advancement per MD.

## 2021-04-05 DIAGNOSIS — F331 Major depressive disorder, recurrent, moderate: Secondary | ICD-10-CM | POA: Diagnosis not present

## 2021-04-15 DIAGNOSIS — F331 Major depressive disorder, recurrent, moderate: Secondary | ICD-10-CM | POA: Diagnosis not present

## 2021-04-18 ENCOUNTER — Other Ambulatory Visit: Payer: Self-pay | Admitting: Nurse Practitioner

## 2021-04-18 DIAGNOSIS — U071 COVID-19: Secondary | ICD-10-CM

## 2021-04-18 NOTE — Progress Notes (Signed)
Sent message, via epic in basket, requesting orders in epic from surgeon.  

## 2021-04-18 NOTE — Telephone Encounter (Signed)
Requested medication (s) are due for refill today: no  Requested medication (s) are on the active medication list: no  Last refill:  02/02/21  Future visit scheduled: no  Notes to clinic:  This medication does not have a protocol to follow, please assess, not on current med list.      Requested Prescriptions  Pending Prescriptions Disp Refills   LAGEVRIO 200 MG CAPS capsule [Pharmacy Med Name: LAGEVRIO (MOLNUPIRAVIR) 200 MG] 40 capsule 0    Sig: TAKE 4 CAPSULES (800 MG TOTAL) BY MOUTH 2 (TWO) TIMES DAILY FOR 5 DAYS.     Off-Protocol Failed - 04/18/2021  1:26 AM      Failed - Medication not assigned to a protocol, review manually.      Failed - Valid encounter within last 12 months    Recent Outpatient Visits   None

## 2021-04-22 ENCOUNTER — Ambulatory Visit: Payer: Self-pay | Admitting: General Surgery

## 2021-04-24 DIAGNOSIS — F331 Major depressive disorder, recurrent, moderate: Secondary | ICD-10-CM | POA: Diagnosis not present

## 2021-04-26 ENCOUNTER — Other Ambulatory Visit (HOSPITAL_COMMUNITY): Payer: BC Managed Care – PPO

## 2021-04-26 NOTE — Patient Instructions (Signed)
DUE TO COVID-19 ONLY ONE VISITOR IS ALLOWED TO COME WITH YOU AND STAY IN THE WAITING ROOM ONLY DURING PRE OP AND PROCEDURE.   **NO VISITORS ARE ALLOWED IN THE SHORT STAY AREA OR RECOVERY ROOM!!**  IF YOU WILL BE ADMITTED INTO THE HOSPITAL YOU ARE ALLOWED ONLY TWO SUPPORT PEOPLE DURING VISITATION HOURS ONLY (7 AM -8PM)   The support person(s) must pass our screening, gel in and out, and wear a mask at all times, including in the patients room. Patients must also wear a mask when staff or their support person are in the room. Visitors GUEST BADGE MUST BE WORN VISIBLY  One adult visitor may remain with you overnight and MUST be in the room by 8 P.M.  No visitors under the age of 61. Any visitor under the age of 103 must be accompanied by an adult.    COVID SWAB TESTING MUST BE COMPLETED ON:  05/02/21   Site: [facility] [address] [place] Enter: Main Entrance have a seat in the waiting area to the right of main entrance (DO NOT CHECK IN AT ADMITTING!!!!!) Dial: [phone] to alert staff you have arrived  You are not required to quarantine, however you are required to wear a well-fitted mask when you are out and around people not in your household.  Hand Hygiene often Do NOT share personal items Notify your provider if you are in close contact with someone who has COVID or you develop fever 100.4 or greater, new onset of sneezing, cough, sore throat, shortness of breath or body aches.  [facility] Entrance [address], [address], must go inside of the hospital, NOT A DRIVE THRU!  (Must self quarantine after testing. Follow instructions on handout.)       Your procedure is scheduled on: 05/06/21   Report to [facility] Main Entrance    Report to admitting at : 10:00 AM   Call this number if you have problems the morning of surgery [phone] Eat a light diet the day before surgery.  Examples including soups,  broths, toast, yogurt, mashed potatoes.  Things to avoid include carbonated beverages (fizzy beverages), raw fruits and raw vegetables, or beans.   If your bowels are filled with gas, your surgeon will have difficulty visualizing your pelvic organs which increases your surgical risks.    MORNING OF SURGERY DRINK:   DRINK 1 G2 drink BEFORE YOU LEAVE HOME ( AT: 9:00 AM), DRINK ALL OF THE  G2 DRINK AT ONE TIME.   NO SOLID FOOD AFTER 600 PM THE NIGHT BEFORE YOUR SURGERY. YOU MAY DRINK CLEAR FLUIDS. THE G2 DRINK YOU DRINK BEFORE YOU LEAVE HOME WILL BE THE LAST FLUIDS YOU DRINK BEFORE SURGERY.  PAIN IS EXPECTED AFTER SURGERY AND WILL NOT BE COMPLETELY ELIMINATED. AMBULATION AND TYLENOL WILL HELP REDUCE INCISIONAL AND GAS PAIN. MOVEMENT IS KEY!  YOU ARE EXPECTED TO BE OUT OF BED WITHIN 4 HOURS OF ADMISSION TO YOUR PATIENT ROOM.  SITTING IN THE RECLINER THROUGHOUT THE DAY IS IMPORTANT FOR DRINKING FLUIDS AND MOVING GAS THROUGHOUT THE GI TRACT.  COMPRESSION STOCKINGS SHOULD BE WORN [facility] STAY UNLESS YOU ARE WALKING.   INCENTIVE SPIROMETER SHOULD BE USED EVERY HOUR WHILE AWAKE TO DECREASE POST-OPERATIVE COMPLICATIONS SUCH AS PNEUMONIA.  WHEN DISCHARGED HOME, IT IS IMPORTANT TO CONTINUE TO WALK EVERY HOUR AND USE THE INCENTIVE SPIROMETER EVERY HOUR.   May have liquids until : 9:15 AM  day of surgery  CLEAR LIQUID DIET  Foods Allowed                                                                     Foods Excluded  Water, Black Coffee and tea, regular and decaf                             liquids that you cannot  Plain Jell-O in any flavor  (No red)                                           see through such as: Fruit ices (not with fruit pulp)                                     milk, soups, orange juice              Iced Popsicles (No red)                                    All solid food                                   Apple juices Sports drinks like Gatorade (No  red) Lightly seasoned clear broth or consume(fat free) Sugar Sample Menu Breakfast                                Lunch                                     Supper Cranberry juice                    Beef broth                            Chicken broth Jell-O                                     Grape juice                           Apple juice Coffee or tea                        Jell-O                                      Popsicle  Coffee or tea                        Coffee or tea   Complete one Gatorade drink the morning of surgery 3 hours prior to scheduled surgery at: 9:00 AM.    The day of surgery:  Drink ONE (1) Pre-Surgery Clear Ensure or G2 at AM the morning of surgery. Drink in one sitting. Do not sip.  This drink was given to you during your hospital  pre-op appointment visit. Nothing else to drink after completing the  Pre-Surgery Clear Ensure or G2.          If you have questions, please contact your surgeons office.  FOLLOW BOWEL PREP AND ANY ADDITIONAL PRE OP INSTRUCTIONS YOU RECEIVED FROM YOUR SURGEON'S OFFICE!!!    Oral Hygiene is also important to reduce your risk of infection.                                    Remember - BRUSH YOUR TEETH THE MORNING OF SURGERY WITH YOUR REGULAR TOOTHPASTE   Do NOT smoke after Midnight   Take these medicines the morning of surgery with A SIP OF WATER:   DO NOT TAKE ANY ORAL DIABETIC MEDICATIONS DAY OF YOUR SURGERY                              You may not have any metal on your body including hair pins, jewelry, and body piercing             Do not wear make-up, lotions, powders, perfumes/cologne, or deodorant  Do not wear nail polish including gel and S&S, artificial/acrylic nails, or any other type of covering on natural nails including finger and toenails. If you have artificial nails, gel coating, etc. that needs to be removed by a nail salon please have this removed prior to surgery  or surgery may need to be canceled/ delayed if the surgeon/ anesthesia feels like they are unable to be safely monitored.   Do not shave  48 hours prior to surgery.    Do not bring valuables to the hospital. [facility] IS NOT             RESPONSIBLE   FOR VALUABLES.   Contacts, dentures or bridgework may not be worn into surgery.   Bring small overnight bag day of surgery.    Patients discharged on the day of surgery will not be allowed to drive home.  Someone needs to stay with you for the first 24 hours after anesthesia.   Special Instructions: Bring a copy of your healthcare power of attorney and living will documents         the day of surgery if you haven't scanned them before.              Please read over the following fact sheets you were given: IF YOU HAVE QUESTIONS ABOUT YOUR PRE-OP INSTRUCTIONS PLEASE CALL [phone]     [facility] - Preparing for Surgery Before surgery, you can play an important role.  Because skin is not sterile, your skin needs to be as free of germs as possible.  You can reduce the number of germs on your skin by washing with CHG (chlorahexidine gluconate) soap before surgery.  CHG is an antiseptic cleaner which kills germs and bonds  with the skin to continue killing germs even after washing. Please DO NOT use if you have an allergy to CHG or antibacterial soaps.  If your skin becomes reddened/irritated stop using the CHG and inform your nurse when you arrive at Short Stay. Do not shave (including legs and underarms) for at least 48 hours prior to the first CHG shower.  You may shave your face/neck. Please follow these instructions carefully:  1.  Shower with CHG Soap the night before surgery and the  morning of Surgery.  2.  If you choose to wash your hair, wash your hair first as usual with your  normal  shampoo.  3.  After you shampoo, rinse your hair and body thoroughly to remove the  shampoo.                           4.  Use CHG as you would any other  liquid soap.  You can apply chg directly  to the skin and wash                       Gently with a scrungie or clean washcloth.  5.  Apply the CHG Soap to your body ONLY FROM THE NECK DOWN.   Do not use on face/ open                           Wound or open sores. Avoid contact with eyes, ears mouth and genitals (private parts).                       Wash face,  Genitals (private parts) with your normal soap.             6.  Wash thoroughly, paying special attention to the area where your surgery  will be performed.  7.  Thoroughly rinse your body with warm water from the neck down.  8.  DO NOT shower/wash with your normal soap after using and rinsing off  the CHG Soap.                9.  Pat yourself dry with a clean towel.            10.  Wear clean pajamas.            11.  Place clean sheets on your bed the night of your first shower and do not  sleep with pets. Day of Surgery : Do not apply any lotions/deodorants the morning of surgery.  Please wear clean clothes to the hospital/surgery center.  FAILURE TO FOLLOW THESE INSTRUCTIONS MAY RESULT IN THE CANCELLATION OF YOUR SURGERY PATIENT SIGNATURE_________________________________  NURSE SIGNATURE__________________________________  ________________________________________________________________________

## 2021-04-29 ENCOUNTER — Encounter (HOSPITAL_COMMUNITY)
Admission: RE | Admit: 2021-04-29 | Discharge: 2021-04-29 | Disposition: A | Discharge status: Home / Self Care | Payer: BC Managed Care – PPO | Source: Ambulatory Visit | Attending: General Surgery | Admitting: General Surgery

## 2021-04-29 ENCOUNTER — Other Ambulatory Visit: Payer: Self-pay

## 2021-04-29 ENCOUNTER — Encounter (HOSPITAL_COMMUNITY): Payer: Self-pay

## 2021-04-29 VITALS — BP 130/79 | HR 75 | Temp 98.9°F | Ht 64.0 in | Wt 255.0 lb

## 2021-04-29 DIAGNOSIS — Z20822 Contact with and (suspected) exposure to covid-19: Secondary | ICD-10-CM | POA: Insufficient documentation

## 2021-04-29 DIAGNOSIS — Z01812 Encounter for preprocedural laboratory examination: Secondary | ICD-10-CM | POA: Insufficient documentation

## 2021-04-29 DIAGNOSIS — Z01818 Encounter for other preprocedural examination: Secondary | ICD-10-CM

## 2021-04-29 HISTORY — DX: Cardiac murmur, unspecified: R01.1

## 2021-04-29 LAB — CBC WITH DIFFERENTIAL/PLATELET
Abs Immature Granulocytes: 0.03 10*3/uL (ref 0.00–0.07)
Basophils Absolute: 0 10*3/uL (ref 0.0–0.1)
Basophils Relative: 1 %
Eosinophils Absolute: 0.2 10*3/uL (ref 0.0–0.5)
Eosinophils Relative: 3 %
HCT: 36.7 % (ref 36.0–46.0)
Hemoglobin: 12.4 g/dL (ref 12.0–15.0)
Immature Granulocytes: 1 %
Lymphocytes Relative: 31 %
Lymphs Abs: 1.9 10*3/uL (ref 0.7–4.0)
MCH: 29.4 pg (ref 26.0–34.0)
MCHC: 33.8 g/dL (ref 30.0–36.0)
MCV: 87 fL (ref 80.0–100.0)
Monocytes Absolute: 0.5 10*3/uL (ref 0.1–1.0)
Monocytes Relative: 9 %
Neutro Abs: 3.6 10*3/uL (ref 1.7–7.7)
Neutrophils Relative %: 55 %
Platelets: 231 10*3/uL (ref 150–400)
RBC: 4.22 MIL/uL (ref 3.87–5.11)
RDW: 12.7 % (ref 11.5–15.5)
WBC: 6.3 10*3/uL (ref 4.0–10.5)
nRBC: 0 % (ref 0.0–0.2)

## 2021-04-29 LAB — COMPREHENSIVE METABOLIC PANEL
ALT: 27 U/L (ref 0–44)
AST: 20 U/L (ref 15–41)
Albumin: 4.1 g/dL (ref 3.5–5.0)
Alkaline Phosphatase: 66 U/L (ref 38–126)
Anion gap: 5 (ref 5–15)
BUN: 18 mg/dL (ref 6–20)
CO2: 21 mmol/L — ABNORMAL LOW (ref 22–32)
Calcium: 8.5 mg/dL — ABNORMAL LOW (ref 8.9–10.3)
Chloride: 109 mmol/L (ref 98–111)
Creatinine, Ser: 0.75 mg/dL (ref 0.44–1.00)
GFR, Estimated: >60 mL/min (ref >60)
Glucose, Bld: 91 mg/dL (ref 70–99)
Potassium: 3.6 mmol/L (ref 3.5–5.1)
Sodium: 135 mmol/L (ref 135–145)
Total Bilirubin: 0.4 mg/dL (ref 0.3–1.2)
Total Protein: 7 g/dL (ref 6.5–8.1)

## 2021-04-29 NOTE — Progress Notes (Addendum)
COVID Vaccine Completed: Yes Date COVID Vaccine completed: 01/09/20 x 3 COVID vaccine manufacturer:  Moderna    COVID Test: 05/02/21 Bowel prep reminder: N/A  PCP - NO PCP Cardiologist - N/A. Last @ 2017  Chest x-ray -  EKG - 10/05/20 :EPIC Stress Test -  ECHO - 07/30/15 Cardiac Cath -  Pacemaker/ICD device last checked:  Sleep Study -  CPAP -   Fasting Blood Sugar -  Checks Blood Sugar _____ times a day  Blood Thinner Instructions: Aspirin Instructions: Last Dose:  Anesthesia review: Hx: Arrhythmias,seizure (last one 10 years ago)  Patient denies shortness of breath, fever, cough and chest pain at PAT appointment   Patient verbalized understanding of instructions that were given to them at the PAT appointment. Patient was also instructed that they will need to review over the PAT instructions again at home before surgery.

## 2021-05-02 ENCOUNTER — Other Ambulatory Visit: Payer: Self-pay

## 2021-05-02 ENCOUNTER — Encounter (HOSPITAL_COMMUNITY)
Admission: RE | Admit: 2021-05-02 | Discharge: 2021-05-02 | Disposition: A | Discharge status: Home / Self Care | Payer: BC Managed Care – PPO | Source: Ambulatory Visit | Attending: General Surgery | Admitting: General Surgery

## 2021-05-02 DIAGNOSIS — Z20822 Contact with and (suspected) exposure to covid-19: Secondary | ICD-10-CM | POA: Insufficient documentation

## 2021-05-02 DIAGNOSIS — Z01812 Encounter for preprocedural laboratory examination: Secondary | ICD-10-CM | POA: Diagnosis not present

## 2021-05-02 DIAGNOSIS — Z01818 Encounter for other preprocedural examination: Secondary | ICD-10-CM

## 2021-05-02 LAB — SARS CORONAVIRUS 2 (TAT 6-24 HRS): SARS Coronavirus 2: NEGATIVE

## 2021-05-06 ENCOUNTER — Other Ambulatory Visit: Payer: Self-pay

## 2021-05-06 ENCOUNTER — Encounter (HOSPITAL_COMMUNITY)
Admission: RE | Disposition: A | Discharge status: Home / Self Care | Payer: Self-pay | Source: Home / Self Care | Attending: General Surgery

## 2021-05-06 ENCOUNTER — Inpatient Hospital Stay (HOSPITAL_COMMUNITY)
Admission: RE | Admit: 2021-05-06 | Discharge: 2021-05-07 | DRG: 621 | Disposition: A | Discharge status: Home / Self Care | Payer: BC Managed Care – PPO | Attending: General Surgery | Admitting: General Surgery

## 2021-05-06 ENCOUNTER — Inpatient Hospital Stay (HOSPITAL_COMMUNITY): Payer: BC Managed Care – PPO | Admitting: Anesthesiology

## 2021-05-06 ENCOUNTER — Inpatient Hospital Stay (HOSPITAL_COMMUNITY): Payer: BC Managed Care – PPO | Admitting: Physician Assistant

## 2021-05-06 ENCOUNTER — Encounter (HOSPITAL_COMMUNITY): Payer: Self-pay | Admitting: General Surgery

## 2021-05-06 DIAGNOSIS — F32A Depression, unspecified: Secondary | ICD-10-CM | POA: Diagnosis not present

## 2021-05-06 DIAGNOSIS — Z8249 Family history of ischemic heart disease and other diseases of the circulatory system: Secondary | ICD-10-CM

## 2021-05-06 DIAGNOSIS — Z6841 Body Mass Index (BMI) 40.0 and over, adult: Secondary | ICD-10-CM | POA: Diagnosis not present

## 2021-05-06 DIAGNOSIS — Z6834 Body mass index (BMI) 34.0-34.9, adult: Secondary | ICD-10-CM | POA: Diagnosis present

## 2021-05-06 DIAGNOSIS — Z823 Family history of stroke: Secondary | ICD-10-CM

## 2021-05-06 DIAGNOSIS — Z833 Family history of diabetes mellitus: Secondary | ICD-10-CM | POA: Diagnosis not present

## 2021-05-06 DIAGNOSIS — F418 Other specified anxiety disorders: Secondary | ICD-10-CM | POA: Diagnosis not present

## 2021-05-06 HISTORY — PX: LAPAROSCOPIC GASTRIC SLEEVE RESECTION: SHX5895

## 2021-05-06 HISTORY — PX: UPPER GI ENDOSCOPY: SHX6162

## 2021-05-06 LAB — TYPE AND SCREEN
ABO/RH(D): O POS
Antibody Screen: NEGATIVE

## 2021-05-06 LAB — PREGNANCY, URINE: Preg Test, Ur: NEGATIVE

## 2021-05-06 LAB — ABO/RH: ABO/RH(D): O POS

## 2021-05-06 LAB — HEMOGLOBIN AND HEMATOCRIT, BLOOD
HCT: 36.5 % (ref 36.0–46.0)
Hemoglobin: 12.3 g/dL (ref 12.0–15.0)

## 2021-05-06 SURGERY — GASTRECTOMY, SLEEVE, LAPAROSCOPIC
Anesthesia: General

## 2021-05-06 MED ORDER — HEPARIN SODIUM (PORCINE) 5000 UNIT/ML IJ SOLN
5000.0000 [IU] | INTRAMUSCULAR | Status: AC
  Administered 2021-05-06: 5000 [IU] via SUBCUTANEOUS
  Filled 2021-05-06: qty 1

## 2021-05-06 MED ORDER — AMISULPRIDE (ANTIEMETIC) 5 MG/2ML IV SOLN
INTRAVENOUS | Status: AC
  Filled 2021-05-06: qty 4

## 2021-05-06 MED ORDER — MEPERIDINE HCL 50 MG/ML IJ SOLN: 6.2500 mg | INTRAMUSCULAR | Status: DC | PRN

## 2021-05-06 MED ORDER — ROCURONIUM BROMIDE 10 MG/ML (PF) SYRINGE
PREFILLED_SYRINGE | INTRAVENOUS | Status: DC | PRN
Start: 2021-05-06 — End: 2021-05-06
  Administered 2021-05-06: 100 mg via INTRAVENOUS

## 2021-05-06 MED ORDER — BUPIVACAINE LIPOSOME 1.3 % IJ SUSP
INTRAMUSCULAR | Status: AC
  Filled 2021-05-06: qty 20

## 2021-05-06 MED ORDER — MIDAZOLAM HCL 2 MG/2ML IJ SOLN
INTRAMUSCULAR | Status: AC
  Filled 2021-05-06: qty 2

## 2021-05-06 MED ORDER — ACETAMINOPHEN 160 MG/5ML PO SOLN: 1000.0000 mg | Freq: Three times a day (TID) | ORAL | Status: DC

## 2021-05-06 MED ORDER — SIMETHICONE 80 MG PO CHEW
80.0000 mg | CHEWABLE_TABLET | Freq: Four times a day (QID) | ORAL | Status: DC | PRN
  Administered 2021-05-06 – 2021-05-07 (×2): 80 mg via ORAL
  Filled 2021-05-06 (×2): qty 1

## 2021-05-06 MED ORDER — KETAMINE HCL 50 MG/5ML IJ SOSY
PREFILLED_SYRINGE | INTRAMUSCULAR | Status: AC
  Filled 2021-05-06: qty 5

## 2021-05-06 MED ORDER — LACTATED RINGERS IR SOLN
Status: DC | PRN
  Administered 2021-05-06: 1000 mL

## 2021-05-06 MED ORDER — OXYCODONE HCL 5 MG/5ML PO SOLN: 5.0000 mg | Freq: Once | ORAL | Status: DC | PRN

## 2021-05-06 MED ORDER — ONDANSETRON HCL 4 MG/2ML IJ SOLN
INTRAMUSCULAR | Status: AC
  Filled 2021-05-06: qty 2

## 2021-05-06 MED ORDER — ACETAMINOPHEN 500 MG PO TABS
1000.0000 mg | ORAL_TABLET | ORAL | Status: AC
  Administered 2021-05-06: 1000 mg via ORAL
  Filled 2021-05-06: qty 2

## 2021-05-06 MED ORDER — PHENYLEPHRINE 40 MCG/ML (10ML) SYRINGE FOR IV PUSH (FOR BLOOD PRESSURE SUPPORT)
PREFILLED_SYRINGE | INTRAVENOUS | Status: AC
  Filled 2021-05-06: qty 10

## 2021-05-06 MED ORDER — HYDROMORPHONE HCL 1 MG/ML IJ SOLN
0.2500 mg | INTRAMUSCULAR | Status: DC | PRN
  Administered 2021-05-06: 0.25 mg via INTRAVENOUS
  Administered 2021-05-06: 0.5 mg via INTRAVENOUS

## 2021-05-06 MED ORDER — CITALOPRAM HYDROBROMIDE 20 MG PO TABS
40.0000 mg | ORAL_TABLET | Freq: Every day | ORAL | Status: DC
Start: 2021-05-07 — End: 2021-05-07
  Administered 2021-05-07: 40 mg via ORAL
  Filled 2021-05-06: qty 2

## 2021-05-06 MED ORDER — FENTANYL CITRATE (PF) 250 MCG/5ML IJ SOLN
INTRAMUSCULAR | Status: AC
  Filled 2021-05-06: qty 5

## 2021-05-06 MED ORDER — BUPIVACAINE LIPOSOME 1.3 % IJ SUSP: 20.0000 mL | Freq: Once | INTRAMUSCULAR | Status: DC

## 2021-05-06 MED ORDER — LIDOCAINE HCL 2 % IJ SOLN
INTRAMUSCULAR | Status: AC
  Filled 2021-05-06: qty 20

## 2021-05-06 MED ORDER — MORPHINE SULFATE (PF) 2 MG/ML IV SOLN: 1.0000 mg | INTRAVENOUS | Status: DC | PRN

## 2021-05-06 MED ORDER — AMISULPRIDE (ANTIEMETIC) 5 MG/2ML IV SOLN
10.0000 mg | Freq: Once | INTRAVENOUS | Status: AC | PRN
  Administered 2021-05-06: 10 mg via INTRAVENOUS

## 2021-05-06 MED ORDER — IPRATROPIUM BROMIDE 0.03 % NA SOLN
2.0000 | Freq: Two times a day (BID) | NASAL | Status: DC | PRN
  Filled 2021-05-06: qty 30

## 2021-05-06 MED ORDER — ONDANSETRON HCL 4 MG/2ML IJ SOLN
INTRAMUSCULAR | Status: DC | PRN
  Administered 2021-05-06: 4 mg via INTRAVENOUS

## 2021-05-06 MED ORDER — CHLORHEXIDINE GLUCONATE CLOTH 2 % EX PADS: 6.0000 | MEDICATED_PAD | Freq: Once | CUTANEOUS | Status: DC

## 2021-05-06 MED ORDER — SODIUM CHLORIDE 0.9 % IV SOLN
2.0000 g | INTRAVENOUS | Status: AC
  Administered 2021-05-06: 2 g via INTRAVENOUS
  Filled 2021-05-06: qty 2

## 2021-05-06 MED ORDER — BUPIVACAINE LIPOSOME 1.3 % IJ SUSP
INTRAMUSCULAR | Status: DC | PRN
  Administered 2021-05-06: 20 mL

## 2021-05-06 MED ORDER — CHLORHEXIDINE GLUCONATE 0.12 % MT SOLN
15.0000 mL | Freq: Once | OROMUCOSAL | Status: AC
  Administered 2021-05-06: 15 mL via OROMUCOSAL

## 2021-05-06 MED ORDER — ORAL CARE MOUTH RINSE: 15.0000 mL | Freq: Once | OROMUCOSAL | Status: AC

## 2021-05-06 MED ORDER — APREPITANT 40 MG PO CAPS
40.0000 mg | ORAL_CAPSULE | ORAL | Status: AC
  Administered 2021-05-06: 40 mg via ORAL
  Filled 2021-05-06: qty 1

## 2021-05-06 MED ORDER — SCOPOLAMINE 1 MG/3DAYS TD PT72
1.0000 | MEDICATED_PATCH | TRANSDERMAL | Status: DC
  Administered 2021-05-06: 1.5 mg via TRANSDERMAL
  Filled 2021-05-06: qty 1

## 2021-05-06 MED ORDER — DEXTROSE-NACL 5-0.45 % IV SOLN: INTRAVENOUS | Status: DC

## 2021-05-06 MED ORDER — AMISULPRIDE (ANTIEMETIC) 5 MG/2ML IV SOLN: 10.0000 mg | Freq: Once | INTRAVENOUS | Status: DC | PRN

## 2021-05-06 MED ORDER — KETAMINE HCL-SODIUM CHLORIDE 100-0.9 MG/10ML-% IV SOSY
PREFILLED_SYRINGE | INTRAVENOUS | Status: DC | PRN
  Administered 2021-05-06: 30 mg via INTRAVENOUS

## 2021-05-06 MED ORDER — ACETAMINOPHEN 500 MG PO TABS: 1000.0000 mg | ORAL_TABLET | Freq: Once | ORAL | Status: DC

## 2021-05-06 MED ORDER — STERILE WATER FOR IRRIGATION IR SOLN
Status: DC | PRN
  Administered 2021-05-06: 1000 mL

## 2021-05-06 MED ORDER — ENOXAPARIN SODIUM 30 MG/0.3ML IJ SOSY
30.0000 mg | PREFILLED_SYRINGE | Freq: Two times a day (BID) | INTRAMUSCULAR | Status: DC
  Administered 2021-05-06 – 2021-05-07 (×2): 30 mg via SUBCUTANEOUS
  Filled 2021-05-06 (×2): qty 0.3

## 2021-05-06 MED ORDER — PHENYLEPHRINE 40 MCG/ML (10ML) SYRINGE FOR IV PUSH (FOR BLOOD PRESSURE SUPPORT)
PREFILLED_SYRINGE | INTRAVENOUS | Status: DC | PRN
Start: 2021-05-06 — End: 2021-05-06
  Administered 2021-05-06: 40 ug via INTRAVENOUS
  Administered 2021-05-06: 80 ug via INTRAVENOUS

## 2021-05-06 MED ORDER — OXYCODONE HCL 5 MG/5ML PO SOLN
5.0000 mg | Freq: Four times a day (QID) | ORAL | Status: DC | PRN
  Administered 2021-05-06 – 2021-05-07 (×4): 5 mg via ORAL
  Filled 2021-05-06 (×4): qty 5

## 2021-05-06 MED ORDER — OXYCODONE HCL 5 MG PO TABS: 5.0000 mg | ORAL_TABLET | Freq: Once | ORAL | Status: DC | PRN

## 2021-05-06 MED ORDER — DEXAMETHASONE SODIUM PHOSPHATE 4 MG/ML IJ SOLN
INTRAMUSCULAR | Status: DC | PRN
  Administered 2021-05-06: 10 mg via INTRAVENOUS

## 2021-05-06 MED ORDER — ENSURE MAX PROTEIN PO LIQD
2.0000 [oz_av] | ORAL | Status: DC
  Administered 2021-05-07 (×3): 2 [oz_av] via ORAL

## 2021-05-06 MED ORDER — MIDAZOLAM HCL 5 MG/5ML IJ SOLN
INTRAMUSCULAR | Status: DC | PRN
Start: 2021-05-06 — End: 2021-05-06
  Administered 2021-05-06: 2 mg via INTRAVENOUS

## 2021-05-06 MED ORDER — FLUTICASONE PROPIONATE 50 MCG/ACT NA SUSP
2.0000 | Freq: Every day | NASAL | Status: DC | PRN
  Filled 2021-05-06: qty 16

## 2021-05-06 MED ORDER — SUGAMMADEX SODIUM 500 MG/5ML IV SOLN
INTRAVENOUS | Status: DC | PRN
Start: 2021-05-06 — End: 2021-05-06
  Administered 2021-05-06: 300 mg via INTRAVENOUS
  Administered 2021-05-06: 100 mg via INTRAVENOUS

## 2021-05-06 MED ORDER — FENTANYL CITRATE (PF) 100 MCG/2ML IJ SOLN
INTRAMUSCULAR | Status: DC | PRN
  Administered 2021-05-06: 100 ug via INTRAVENOUS

## 2021-05-06 MED ORDER — PROPOFOL 10 MG/ML IV BOLUS
INTRAVENOUS | Status: DC | PRN
  Administered 2021-05-06: 200 mg via INTRAVENOUS

## 2021-05-06 MED ORDER — BUPIVACAINE-EPINEPHRINE (PF) 0.25% -1:200000 IJ SOLN
INTRAMUSCULAR | Status: AC
  Filled 2021-05-06: qty 30

## 2021-05-06 MED ORDER — SCOPOLAMINE 1 MG/3DAYS TD PT72: 1.0000 | MEDICATED_PATCH | TRANSDERMAL | Status: DC

## 2021-05-06 MED ORDER — LIDOCAINE 2% (20 MG/ML) 5 ML SYRINGE
INTRAMUSCULAR | Status: DC | PRN
  Administered 2021-05-06: 60 mg via INTRAVENOUS

## 2021-05-06 MED ORDER — PROPOFOL 10 MG/ML IV BOLUS
INTRAVENOUS | Status: AC
  Filled 2021-05-06: qty 20

## 2021-05-06 MED ORDER — ROCURONIUM BROMIDE 10 MG/ML (PF) SYRINGE
PREFILLED_SYRINGE | INTRAVENOUS | Status: AC
  Filled 2021-05-06: qty 10

## 2021-05-06 MED ORDER — HYDROMORPHONE HCL 1 MG/ML IJ SOLN: 0.2500 mg | INTRAMUSCULAR | Status: DC | PRN

## 2021-05-06 MED ORDER — GABAPENTIN 100 MG PO CAPS
100.0000 mg | ORAL_CAPSULE | Freq: Three times a day (TID) | ORAL | Status: DC | PRN
  Administered 2021-05-06 – 2021-05-07 (×2): 100 mg via ORAL
  Filled 2021-05-06 (×2): qty 1

## 2021-05-06 MED ORDER — FAMOTIDINE IN NACL 20-0.9 MG/50ML-% IV SOLN
20.0000 mg | Freq: Two times a day (BID) | INTRAVENOUS | Status: DC
  Administered 2021-05-06 – 2021-05-07 (×3): 20 mg via INTRAVENOUS
  Filled 2021-05-06 (×3): qty 50

## 2021-05-06 MED ORDER — HYDROMORPHONE HCL 1 MG/ML IJ SOLN
INTRAMUSCULAR | Status: AC
  Administered 2021-05-06: 0.5 mg via INTRAVENOUS
  Filled 2021-05-06: qty 1

## 2021-05-06 MED ORDER — DEXAMETHASONE SODIUM PHOSPHATE 4 MG/ML IJ SOLN: 4.0000 mg | INTRAMUSCULAR | Status: DC

## 2021-05-06 MED ORDER — BUPROPION HCL ER (SR) 150 MG PO TB12
300.0000 mg | ORAL_TABLET | ORAL | Status: DC
  Administered 2021-05-07: 300 mg via ORAL
  Filled 2021-05-06: qty 2

## 2021-05-06 MED ORDER — ONDANSETRON HCL 4 MG/2ML IJ SOLN
4.0000 mg | INTRAMUSCULAR | Status: DC | PRN
  Administered 2021-05-06 (×2): 4 mg via INTRAVENOUS
  Filled 2021-05-06 (×2): qty 2

## 2021-05-06 MED ORDER — BUPIVACAINE-EPINEPHRINE (PF) 0.25% -1:200000 IJ SOLN
INTRAMUSCULAR | Status: DC | PRN
  Administered 2021-05-06: 30 mL

## 2021-05-06 MED ORDER — 0.9 % SODIUM CHLORIDE (POUR BTL) OPTIME
TOPICAL | Status: DC | PRN
  Administered 2021-05-06: 1000 mL

## 2021-05-06 MED ORDER — DEXAMETHASONE SODIUM PHOSPHATE 10 MG/ML IJ SOLN
INTRAMUSCULAR | Status: AC
  Filled 2021-05-06: qty 1

## 2021-05-06 MED ORDER — ACETAMINOPHEN 500 MG PO TABS
1000.0000 mg | ORAL_TABLET | Freq: Three times a day (TID) | ORAL | Status: DC
  Administered 2021-05-06 – 2021-05-07 (×2): 1000 mg via ORAL
  Filled 2021-05-06 (×2): qty 2

## 2021-05-06 MED ORDER — HYDROMORPHONE HCL 1 MG/ML IJ SOLN
INTRAMUSCULAR | Status: AC
  Filled 2021-05-06: qty 1

## 2021-05-06 MED ORDER — GABAPENTIN 400 MG PO CAPS
800.0000 mg | ORAL_CAPSULE | Freq: Every day | ORAL | Status: DC
  Administered 2021-05-06: 800 mg via ORAL
  Filled 2021-05-06: qty 2

## 2021-05-06 MED ORDER — ONDANSETRON HCL 4 MG/2ML IJ SOLN: 4.0000 mg | Freq: Once | INTRAMUSCULAR | Status: DC | PRN

## 2021-05-06 MED ORDER — HYDRALAZINE HCL 20 MG/ML IJ SOLN: 10.0000 mg | INTRAMUSCULAR | Status: DC | PRN

## 2021-05-06 MED ORDER — LACTATED RINGERS IV SOLN
INTRAVENOUS | Status: DC
  Administered 2021-05-06: 1000 mL via INTRAVENOUS

## 2021-05-06 SURGICAL SUPPLY — 66 items
ADH SKN CLS APL DERMABOND .7 (GAUZE/BANDAGES/DRESSINGS) ×1
APL PRP STRL LF DISP 70% ISPRP (MISCELLANEOUS) ×1
APL SKNCLS STERI-STRIP NONHPOA (GAUZE/BANDAGES/DRESSINGS) ×1
APPLIER CLIP ROT 13.4 12 LRG (CLIP)
APR CLP LRG 13.4X12 ROT 20 MLT (CLIP)
BAG COUNTER SPONGE SURGICOUNT (BAG) IMPLANT
BAG LAPAROSCOPIC 12 15 PORT 16 (BASKET) ×1 IMPLANT
BAG RETRIEVAL 12/15 (BASKET) ×2
BAG SPNG CNTER NS LX DISP (BAG)
BENZOIN TINCTURE PRP APPL 2/3 (GAUZE/BANDAGES/DRESSINGS) ×2 IMPLANT
BLADE SURG SZ11 CARB STEEL (BLADE) ×2 IMPLANT
BNDG ADH 1X3 SHEER STRL LF (GAUZE/BANDAGES/DRESSINGS) ×12 IMPLANT
BNDG ADH THN 3X1 STRL LF (GAUZE/BANDAGES/DRESSINGS) ×6
CABLE HIGH FREQUENCY MONO STRZ (ELECTRODE) IMPLANT
CHLORAPREP W/TINT 26 (MISCELLANEOUS) ×2 IMPLANT
CLIP APPLIE ROT 13.4 12 LRG (CLIP) IMPLANT
COVER SURGICAL LIGHT HANDLE (MISCELLANEOUS) ×2 IMPLANT
DERMABOND ADVANCED (GAUZE/BANDAGES/DRESSINGS) ×1
DERMABOND ADVANCED .7 DNX12 (GAUZE/BANDAGES/DRESSINGS) IMPLANT
DRAPE UTILITY XL STRL (DRAPES) ×4 IMPLANT
ELECT REM PT RETURN 15FT ADLT (MISCELLANEOUS) ×2 IMPLANT
GAUZE 4X4 16PLY ~~LOC~~+RFID DBL (SPONGE) ×2 IMPLANT
GLOVE SURG POLYISO LF SZ7 (GLOVE) ×2 IMPLANT
GLOVE SURG UNDER POLY LF SZ7 (GLOVE) ×2 IMPLANT
GOWN STRL REUS W/TWL LRG LVL3 (GOWN DISPOSABLE) ×2 IMPLANT
GOWN STRL REUS W/TWL XL LVL3 (GOWN DISPOSABLE) ×6 IMPLANT
GRASPER SUT TROCAR 14GX15 (MISCELLANEOUS) ×2 IMPLANT
IRRIG SUCT STRYKERFLOW 2 WTIP (MISCELLANEOUS) ×2
IRRIGATION SUCT STRKRFLW 2 WTP (MISCELLANEOUS) ×1 IMPLANT
KIT BASIN OR (CUSTOM PROCEDURE TRAY) ×2 IMPLANT
KIT TURNOVER KIT A (KITS) IMPLANT
MARKER SKIN DUAL TIP RULER LAB (MISCELLANEOUS) ×2 IMPLANT
MAT PREVALON FULL STRYKER (MISCELLANEOUS) ×1 IMPLANT
NDL SPNL 22GX3.5 QUINCKE BK (NEEDLE) ×1 IMPLANT
NEEDLE SPNL 22GX3.5 QUINCKE BK (NEEDLE) ×2 IMPLANT
PACK UNIVERSAL I (CUSTOM PROCEDURE TRAY) ×2 IMPLANT
RELOAD STAPLE 60 3.6 BLU REG (STAPLE) IMPLANT
RELOAD STAPLE 60 3.8 GOLD REG (STAPLE) IMPLANT
RELOAD STAPLE 60 4.1 GRN THCK (STAPLE) IMPLANT
RELOAD STAPLER BLUE 60MM (STAPLE) ×4 IMPLANT
RELOAD STAPLER GOLD 60MM (STAPLE) ×1 IMPLANT
RELOAD STAPLER GREEN 60MM (STAPLE) IMPLANT
SCISSORS LAP 5X45 EPIX DISP (ENDOMECHANICALS) IMPLANT
SET TUBE SMOKE EVAC HIGH FLOW (TUBING) ×2 IMPLANT
SHEARS HARMONIC ACE PLUS 45CM (MISCELLANEOUS) ×2 IMPLANT
SLEEVE GASTRECTOMY 40FR VISIGI (MISCELLANEOUS) ×2 IMPLANT
SLEEVE XCEL OPT CAN 5 100 (ENDOMECHANICALS) ×4 IMPLANT
SOL ANTI FOG 6CC (MISCELLANEOUS) ×1 IMPLANT
SOLUTION ANTI FOG 6CC (MISCELLANEOUS) ×1
SPIKE FLUID TRANSFER (MISCELLANEOUS) ×2 IMPLANT
STAPLER ECHELON LONG 3000 60 (ENDOMECHANICALS) ×1 IMPLANT
STAPLER ECHELON LONG 60 440 (INSTRUMENTS) ×1 IMPLANT
STAPLER RELOAD BLUE 60MM (STAPLE) ×8
STAPLER RELOAD GOLD 60MM (STAPLE) ×2
STAPLER RELOAD GREEN 60MM (STAPLE)
STRIP CLOSURE SKIN 1/2X4 (GAUZE/BANDAGES/DRESSINGS) ×2 IMPLANT
SUT ETHIBOND 0 36 GRN (SUTURE) IMPLANT
SUT MNCRL AB 4-0 PS2 18 (SUTURE) ×2 IMPLANT
SUT VICRYL 0 TIES 12 18 (SUTURE) ×2 IMPLANT
SYR 20ML LL LF (SYRINGE) ×2 IMPLANT
SYR 50ML LL SCALE MARK (SYRINGE) ×2 IMPLANT
TOWEL OR 17X26 10 PK STRL BLUE (TOWEL DISPOSABLE) ×2 IMPLANT
TOWEL OR NON WOVEN STRL DISP B (DISPOSABLE) ×2 IMPLANT
TROCAR BLADELESS 15MM (ENDOMECHANICALS) ×2 IMPLANT
TROCAR BLADELESS OPT 5 100 (ENDOMECHANICALS) ×2 IMPLANT
TUBING CONNECTING 10 (TUBING) ×4 IMPLANT

## 2021-05-06 NOTE — H&P (Signed)
Chief Complaint: return weight   History of Present Illness: Catherine Shah is a 36 y.o. female who is seen today for sleeve preoperative discussion.  She has completed all requirements. She has no new medical issues.  Review of Systems: A complete review of systems was obtained from the patient. I have reviewed this information and discussed as appropriate with the patient. See HPI as well for other ROS.  Review of Systems  Constitutional: Negative.  HENT: Negative.  Eyes: Negative.  Respiratory: Negative.  Cardiovascular: Negative.  Gastrointestinal: Negative.  Genitourinary: Negative.  Musculoskeletal: Negative.  Skin: Negative.  Neurological: Negative.  Endo/Heme/Allergies: Negative.  Psychiatric/Behavioral: Negative.    Medical History: Past Medical History:  Diagnosis Date   Seizures (CMS-HCC)   There is no problem list on file for this patient.  Past Surgical History:  Procedure Laterality Date   TONSILLECTOMY N/A    Allergies  Allergen Reactions   Adhesive Other (See Comments)  sensitivity   Current Outpatient Medications on File Prior to Visit  Medication Sig Dispense Refill   buPROPion (WELLBUTRIN SR) 150 MG SR tablet   citalopram (CELEXA) 40 MG tablet Take 40 mg by mouth once daily   gabapentin (NEURONTIN) 100 MG capsule   NUVARING 0.12-0.015 mg/24 hr vaginal ring Place 1 ring vaginally as directed REMOVE AFTER 3 WEEKS & WAIT 7 DAYS BEFORE INSERTING A NEW RING   No current facility-administered medications on file prior to visit.   Family History  Problem Relation Age of Onset   Stroke Mother   Obesity Mother   High blood pressure (Hypertension) Mother   Hyperlipidemia (Elevated cholesterol) Mother   Diabetes Mother   Obesity Father    Social History   Tobacco Use  Smoking Status Never  Smokeless Tobacco Never    Social History   Socioeconomic History   Marital status: Single  Tobacco Use   Smoking status: Never   Smokeless  tobacco: Never  Vaping Use   Vaping Use: Never used  Substance and Sexual Activity   Alcohol use: Never   Drug use: Never   Objective:   Vitals:  04/26/21 0920  BP: 138/86  Pulse: (!) 112  Temp: 36.7 C (98 F)  SpO2: 98%  Weight: (!) 116.4 kg (256 lb 9.6 oz)  Height: 162.6 cm (5\' 4" )   Body mass index is 44.05 kg/m.  Physical Exam Constitutional:  Appearance: Normal appearance.  HENT:  Head: Normocephalic and atraumatic.  Pulmonary:  Effort: Pulmonary effort is normal.  Musculoskeletal:  General: Normal range of motion.  Cervical back: Normal range of motion.  Neurological:  General: No focal deficit present.  Mental Status: She is alert and oriented to person, place, and time. Mental status is at baseline.  Psychiatric:  Mood and Affect: Mood normal.  Behavior: Behavior normal.  Thought Content: Thought content normal.     Labs, Imaging and Diagnostic Testing:  I reviewed dietician notes. I reviewed previous labs.  Assessment and Plan:   Diagnoses and all orders for this visit:  Morbid (severe) obesity due to excess calories (CMS-HCC)  She has completed all requirements and is ready to proceed with sleeve gastrectomy.  The patient meets weight loss surgery criteria. Due to the above reasons, I think laparoscopic vertical sleeve gastrectomy is the best option for the patient.   We discussed LSG. We discussed the preoperative, operative and postoperative process. I explained the surgery in detail including the performance of an EGD near the end of the surgery to test for  leak. We discussed the typical hospital course including a 1-2 day stay baring any complications. The patient was given educational material. I quoted the patient that most patients can lose up to 50-70% of their excess weight. We did discuss the possibility of weight regain several years after the procedure.   The risks of infection, bleeding, pain, scarring, weight regain, too little or too  much weight loss, vitamin deficiencies and need for lifelong vitamin supplementation, hair loss, need for protein supplementation, leaks, stricture, reflux, food intolerance, gallstone formation, hernia, need for reoperation, need for open surgery, injury to spleen or surrounding structures, DVT's, PE, and death again discussed with the patient and the patient expressed understanding and desires to proceed with laparoscopic sleeve gastrectomy, possible open, intraoperative endoscopy.  We discussed that before and after surgery that there would be an alteration in their diet. I explained that we may put them on a diet 2 weeks before surgery. I also explained that they would be on a liquid diet for 2 weeks after surgery. We discussed that they would have to avoid certain foods after surgery. We discussed the importance of physical activity as well as compliance with our dietary and supplement recommendations and routine follow-up.

## 2021-05-06 NOTE — Anesthesia Postprocedure Evaluation (Signed)
Anesthesia Post Note  Patient: Catherine Shah  Procedure(s) Performed: LAPAROSCOPIC GASTRIC SLEEVE RESECTION UPPER GI ENDOSCOPY     Patient location during evaluation: PACU Anesthesia Type: General Level of consciousness: awake and alert, oriented and patient cooperative Pain management: pain level controlled Vital Signs Assessment: post-procedure vital signs reviewed and stable Respiratory status: spontaneous breathing, nonlabored ventilation and respiratory function stable Cardiovascular status: blood pressure returned to baseline and stable Postop Assessment: no apparent nausea or vomiting Anesthetic complications: no   No notable events documented.  Last Vitals:  Vitals:   05/06/21 1345 05/06/21 1400  BP: (!) 155/97 136/83  Pulse: 74 69  Resp: 14 14  Temp:    SpO2: 98% 97%    Last Pain:  Vitals:   05/06/21 1400  TempSrc:   PainSc: Asleep                 Lannie Fields

## 2021-05-06 NOTE — Transfer of Care (Signed)
Immediate Anesthesia Transfer of Care Note  Patient: Catherine Shah  Procedure(s) Performed: LAPAROSCOPIC GASTRIC SLEEVE RESECTION UPPER GI ENDOSCOPY  Patient Location: PACU  Anesthesia Type:General  Level of Consciousness: awake  Airway & Oxygen Therapy: Patient Spontanous Breathing and Patient connected to face mask oxygen  Post-op Assessment: Report given to RN and Post -op Vital signs reviewed and stable  Post vital signs: Reviewed and stable  Last Vitals:  Vitals Value Taken Time  BP 149/92 05/06/21 1320  Temp    Pulse 73 05/06/21 1321  Resp 10 05/06/21 1321  SpO2 97 % 05/06/21 1321  Vitals shown include unvalidated device data.  Last Pain:  Vitals:   05/06/21 1033  TempSrc:   PainSc: 0-No pain         Complications: No notable events documented.

## 2021-05-06 NOTE — Anesthesia Preprocedure Evaluation (Addendum)
Anesthesia Evaluation  Patient identified by MRN, date of birth, ID band Patient awake    Reviewed: Allergy & Precautions, NPO status , Patient's Chart, lab work & pertinent test results  Airway Mallampati: II  TM Distance: >3 FB Neck ROM: Full    Dental  (+) Teeth Intact, Dental Advisory Given, Chipped,    Pulmonary neg pulmonary ROS,    Pulmonary exam normal breath sounds clear to auscultation       Cardiovascular Normal cardiovascular exam Rhythm:Regular Rate:Normal  Echo 2017 w/u for murmur: - Left ventricle: The cavity size was normal. Systolic function was  normal. Wall motion was normal; there were no regional wall  motion abnormalities.  - Aortic valve: Trileaflet; mildly thickened, mildly calcified  leaflets.  - Mitral valve: There was trivial regurgitation.    Neuro/Psych Seizures - (has been about 10 years now, no meds), Well Controlled,  PSYCHIATRIC DISORDERS Anxiety Depression    GI/Hepatic Neg liver ROS, GERD  Controlled,  Endo/Other  Morbid obesityBMI 44  Renal/GU negative Renal ROS  negative genitourinary   Musculoskeletal negative musculoskeletal ROS (+)   Abdominal (+) + obese,   Peds  Hematology hct 36.7   Anesthesia Other Findings   Reproductive/Obstetrics negative OB ROS                            Anesthesia Physical Anesthesia Plan  ASA: 3  Anesthesia Plan: General   Post-op Pain Management: Tylenol PO (pre-op)*   Induction: Intravenous  PONV Risk Score and Plan: 3 and Ondansetron, Dexamethasone, Midazolam, Treatment may vary due to age or medical condition and Scopolamine patch - Pre-op  Airway Management Planned: Oral ETT  Additional Equipment: None  Intra-op Plan:   Post-operative Plan: Extubation in OR  Informed Consent: I have reviewed the patients History and Physical, chart, labs and discussed the procedure including the risks, benefits and  alternatives for the proposed anesthesia with the patient or authorized representative who has indicated his/her understanding and acceptance.     Dental advisory given  Plan Discussed with: CRNA  Anesthesia Plan Comments:        Anesthesia Quick Evaluation

## 2021-05-06 NOTE — Op Note (Signed)
Preoperative diagnosis: laparoscopic sleeve gastrectomy  Postoperative diagnosis: Same   Procedure: Upper endoscopy   Surgeon: Charmaine Placido A Ralphine Hinks, M.D.  Anesthesia: Gen.   Description of procedure: The endoscope was placed in the mouth and oropharynx and under endoscopic vision it was advanced to the esophagogastric junction which was identified at 37cm from the teeth.  The pouch was tensely insufflated while the upper abdomen was flooded with irrigation to perform a leak test, which was negative. No bubbles were seen.  The staple line was hemostatic and the lumen was evenly tubular without undue narrowing, angulation or twisting specifically at the incisura angularis. The lumen was decompressed and the scope was withdrawn without difficulty.    Shilynn Hoch A Rahi Chandonnet, M.D. General, Bariatric, & Minimally Invasive Surgery [facility]    

## 2021-05-06 NOTE — Progress Notes (Signed)
PHARMACY CONSULT FOR:  Risk Assessment for Post-Discharge VTE Following Bariatric Surgery  Post-Discharge VTE Risk Assessment: This patient's probability of 30-day post-discharge VTE is increased due to the factors marked: X Sleeve gastrectomy   Liver disorder (transplant, cirrhosis, or nonalcoholic steatohepatitis)   Hx of VTE   Hemorrhage requiring transfusion   GI perforation, leak, or obstruction   ====================================================    Female    Age >/=60 years    BMI >/=50 kg/m2    CHF    Dyspnea at Rest    Paraplegia  X  Non-gastric-band surgery    Operation Time >/=3 hr    Return to OR     Length of Stay >/= 3 d   Hypercoagulable condition   Significant venous stasis    Predicted probability of 30-day post-discharge VTE: 0.16% (mild)  Other patient-specific factors to consider: N/A  Recommendation for Discharge: No pharmacologic prophylaxis post-discharge  Catherine Shah is a 36 y.o. female who underwent laparoscopic gastric sleeve resection on 05/06/21   Case start: 1227 Case end: 1307   Allergies  Allergen Reactions   Tape Other (See Comments)    sensitivity    Patient Measurements: Height: 5\' 4"  (162.6 cm) Weight: 114.3 kg (252 lb) IBW/kg (Calculated) : 54.7 Body mass index is 43.26 kg/m.  No results for input(s): WBC, HGB, HCT, PLT, APTT, CREATININE, LABCREA, CREATININE, CREAT24HRUR, MG, PHOS, ALBUMIN, PROT, ALBUMIN, AST, ALT, ALKPHOS, BILITOT, BILIDIR, IBILI in the last 72 hours. Estimated Creatinine Clearance: 121.6 mL/min (by C-G formula based on SCr of 0.75 mg/dL).    Past Medical History:  Diagnosis Date   Arrhythmia    Generalized anxiety disorder    Heart murmur    HPV (human papilloma virus) infection    Major depression    Panic attacks    Seizure (HCC)    last in 2013   STD (sexually transmitted disease)    hx of HPV and Chlamydia     Medications Prior to Admission  Medication Sig Dispense Refill Last Dose    buPROPion (WELLBUTRIN SR) 150 MG 12 hr tablet Take 300 mg by mouth every morning.   05/06/2021 at 0800   Calcium Carb-Cholecalciferol (CALCIUM 500/VITAMIN D PO) Take 1 tablet by mouth in the morning, at noon, and at bedtime.   05/04/2021   citalopram (CELEXA) 40 MG tablet Take 40 mg by mouth daily.   05/06/2021 at 0800   gabapentin (NEURONTIN) 800 MG tablet Take 800 mg by mouth at bedtime.    05/05/2021   ipratropium (ATROVENT) 0.03 % nasal spray Place 2 sprays into both nostrils every 12 (twelve) hours. (Patient taking differently: Place 2 sprays into both nostrils 2 (two) times daily as needed for rhinitis.) 30 mL 0 Past Week   Multiple Vitamins-Minerals (BARIATRIC MULTIVITAMINS/IRON PO) Take 1 tablet by mouth in the morning and at bedtime.   05/05/2021   fluticasone (FLONASE) 50 MCG/ACT nasal spray Place 2 sprays into both nostrils daily. (Patient taking differently: Place 2 sprays into both nostrils daily as needed for allergies or rhinitis.) 16 g 0 More than a month   gabapentin (NEURONTIN) 100 MG capsule Take 100 mg by mouth 3 (three) times daily as needed (pain).   More than a month   metroNIDAZOLE (FLAGYL) 500 MG tablet Take 1 tablet (500 mg total) by mouth 2 (two) times daily. (Patient not taking: Reported on 04/23/2021) 14 tablet 0 Not Taking   NUVARING 0.12-0.015 MG/24HR vaginal ring Insert vaginally and leave in place for 3 consecutive weeks,  then remove for 1 week. 3 each 4 More than a month   Pseudoephedrine-Ibuprofen (ADVIL COLD/SINUS) 30-200 MG TABS Take 1 tablet by mouth 2 (two) times daily as needed (cold symptoms).   More than a month   Rexford Maus, PharmD 05/06/2021 1:41 PM

## 2021-05-06 NOTE — Anesthesia Procedure Notes (Signed)
Procedure Name: Intubation Date/Time: 05/06/2021 11:53 AM Performed by: Ludwig Lean, CRNA Pre-anesthesia Checklist: Patient identified, Emergency Drugs available, Suction available and Patient being monitored Patient Re-evaluated:Patient Re-evaluated prior to induction Oxygen Delivery Method: Circle system utilized Preoxygenation: Pre-oxygenation with 100% oxygen Induction Type: IV induction Ventilation: Mask ventilation without difficulty Laryngoscope Size: Miller and 3 Grade View: Grade I Tube type: Oral Tube size: 7.0 mm Number of attempts: 1 Airway Equipment and Method: Stylet and Oral airway Placement Confirmation: ETT inserted through vocal cords under direct vision, positive ETCO2 and breath sounds checked- equal and bilateral Secured at: 21 cm Tube secured with: Tape Dental Injury: Teeth and Oropharynx as per pre-operative assessment

## 2021-05-06 NOTE — Op Note (Addendum)
Preop Diagnosis: Obesity Class III  Postop Diagnosis: same  Procedure performed: laparoscopic Sleeve Gastrectomy  Assitant: Phylliss Blakes  Indications:  The patient is a 36 y.o. year-old morbidly obese female who has been followed in the Bariatric Clinic as an outpatient. This patient was diagnosed with morbid obesity with a BMI of Body mass index is 43.26 kg/m.  The patient was counseled extensively in the Bariatric Outpatient Clinic and after a thorough explanation of the risks and benefits of surgery (including death from complications, bowel leak, infection such as peritonitis and/or sepsis, internal hernia, bleeding, need for blood transfusion, bowel obstruction, organ failure, pulmonary embolus, deep venous thrombosis, wound infection, incisional hernia, skin breakdown, and others entailed on the consent form) and after a compliant diet and exercise program, the patient was scheduled for an elective laparoscopic sleeve gastrectomy.  Description of Operation:  Following informed consent, the patient was taken to the operating room and placed on the operating table in the supine position.  She had previously received prophylactic antibiotics and subcutaneous heparin for DVT prophylaxis in the pre-op holding area.  After induction of general endotracheal anesthesia by the anesthesiologist, the patient underwent placement of sequential compression devices and an oro-gastric tube.  A timeout was confirmed by the surgery and anesthesia teams.  The patient was adequately padded at all pressure points and placed on a footboard to prevent slippage from the OR table during extremes of position during surgery.  She underwent a routine sterile prep and drape of her entire abdomen.    Next, A transverse incision was made under the left subcostal area and a 93mm optical viewing trocar was introduced into the peritoneal cavity. Pneumoperitoneum was applied with a high flow and low pressure. A laparoscope was  inserted to confirm placement. A extraperitoneal block was then placed at the lateral abdominal wall using exparel diluted with marcaine. 5 additional incisions were placed: 1 13mm trocar to the left of the midline. 1 additional 37mm trocar in the left lateral area, 1 56mm trocar in the right mid abdomen, 1 67mm trocar in the right subcostal area, and a Nathanson retractor was placed through a subxiphoid incision.  There was a small defect anterior to the mid diaphragm seen.  Next, a hole was created through the lesser omentum along the greater curve of the stomach to enter the lesser sac. The vessels along the greater omentum were  Then ligated and divided using the Harmonic scalpel moving towards the spleen and then short gastric vessels were ligated and divided in the same fashion to fully mobilize the fundus. The left crus was identified to ensure completion of the dissection. Next the antrum was measured and dissection continued inferiorly along the greater curve towards the pylorus and stopped 6cm from the pylorus.   A 40Fr ViSiGi dilator was placed into the esophgaus and along the lesser curve of the stomach and placed on suction. 1 2mm Gold load echelon stapler(s) followed by 4 28mm blue load echelon stapler(s) were used to make the resection along the antrum being sure to stay well away from the angularis by angling the jaws of the stapler towards the greater curve and later completing the resection staying along the ViSiGi and ensuring the fundus was not retained by appropriately retracting it lateral. Air was inserted through the ViSiGi to perform a leak test showing no bubbles and a neutral lie of the stomach.  The assistant then went and performed an upper endoscopy and leak test. No bubbles were seen and  the sleeve and antrum distended appropriately. The specimen was then placed in an endocatch bag and removed by the 26mm port. The fascia of the 14mm port was closed with a 0 vicryl by suture  passer. Hemostasis was ensured. Pneumoperitoneum was evacuated, all ports were removed and all incisions closed with 4-0 monocryl suture in subcuticular fashion. Steristrips and bandaids were put in place for dressing. The patient awoke from anesthesia and was brought to pacu in stable condition. All counts were correct.  Estimated blood loss: <26ml  Specimens:  Sleeve gastrectomy  Local Anesthesia: 50 ml Exparel:0.5% Marcaine mix  Post-Op Plan:       Pain Management: PO, prn      Antibiotics: Prophylactic      Anticoagulation: Prophylactic, Starting now      Post Op Studies/Consults: Not applicable      Intended Discharge: within 48h      Intended Outpatient Follow-Up: Two Week      Intended Outpatient Studies: Not Applicable      Other: Not Applicable  Images:    De Blanch Keyatta Tolles

## 2021-05-07 ENCOUNTER — Encounter (HOSPITAL_COMMUNITY): Payer: Self-pay | Admitting: General Surgery

## 2021-05-07 LAB — CBC WITH DIFFERENTIAL/PLATELET
Abs Immature Granulocytes: 0.06 10*3/uL (ref 0.00–0.07)
Basophils Absolute: 0 10*3/uL (ref 0.0–0.1)
Basophils Relative: 0 %
Eosinophils Absolute: 0 10*3/uL (ref 0.0–0.5)
Eosinophils Relative: 0 %
HCT: 36.1 % (ref 36.0–46.0)
Hemoglobin: 12 g/dL (ref 12.0–15.0)
Immature Granulocytes: 1 %
Lymphocytes Relative: 12 %
Lymphs Abs: 1.1 10*3/uL (ref 0.7–4.0)
MCH: 29.4 pg (ref 26.0–34.0)
MCHC: 33.2 g/dL (ref 30.0–36.0)
MCV: 88.5 fL (ref 80.0–100.0)
Monocytes Absolute: 0.6 10*3/uL (ref 0.1–1.0)
Monocytes Relative: 7 %
Neutro Abs: 7.6 10*3/uL (ref 1.7–7.7)
Neutrophils Relative %: 80 %
Platelets: 270 10*3/uL (ref 150–400)
RBC: 4.08 MIL/uL (ref 3.87–5.11)
RDW: 12.9 % (ref 11.5–15.5)
WBC: 9.4 10*3/uL (ref 4.0–10.5)
nRBC: 0 % (ref 0.0–0.2)

## 2021-05-07 LAB — COMPREHENSIVE METABOLIC PANEL
ALT: 28 U/L (ref 0–44)
AST: 19 U/L (ref 15–41)
Albumin: 3.8 g/dL (ref 3.5–5.0)
Alkaline Phosphatase: 64 U/L (ref 38–126)
Anion gap: 6 (ref 5–15)
BUN: 10 mg/dL (ref 6–20)
CO2: 22 mmol/L (ref 22–32)
Calcium: 8.5 mg/dL — ABNORMAL LOW (ref 8.9–10.3)
Chloride: 108 mmol/L (ref 98–111)
Creatinine, Ser: 0.73 mg/dL (ref 0.44–1.00)
GFR, Estimated: >60 mL/min (ref >60)
Glucose, Bld: 127 mg/dL — ABNORMAL HIGH (ref 70–99)
Potassium: 4.2 mmol/L (ref 3.5–5.1)
Sodium: 136 mmol/L (ref 135–145)
Total Bilirubin: 0.4 mg/dL (ref 0.3–1.2)
Total Protein: 6.7 g/dL (ref 6.5–8.1)

## 2021-05-07 LAB — SURGICAL PATHOLOGY

## 2021-05-07 MED ORDER — ONDANSETRON 4 MG PO TBDP: 4.0000 mg | ORAL_TABLET | Freq: Four times a day (QID) | ORAL | 0 refills | Status: DC | PRN

## 2021-05-07 MED ORDER — PANTOPRAZOLE SODIUM 40 MG PO TBEC: 40.0000 mg | DELAYED_RELEASE_TABLET | Freq: Every day | ORAL | 0 refills | Status: DC

## 2021-05-07 MED ORDER — OXYCODONE HCL 5 MG PO TABS: 5.0000 mg | ORAL_TABLET | Freq: Four times a day (QID) | ORAL | 0 refills | Status: DC | PRN

## 2021-05-07 MED ORDER — ACETAMINOPHEN 500 MG PO TABS: 1000.0000 mg | ORAL_TABLET | Freq: Three times a day (TID) | ORAL | 0 refills | Status: AC

## 2021-05-07 NOTE — Progress Notes (Signed)
Patient alert and oriented, Post op day 1.  Provided support and encouragement.  Encouraged pulmonary toilet, ambulation and small sips of liquids.  Completed Clear bari fluid requirement starting 4th cup of protein.  No complaints of nausea or pain.  All questions answered.  Will continue to monitor.

## 2021-05-07 NOTE — Discharge Instructions (Signed)
   GASTRIC BYPASS/SLEEVE  Home Care Instructions   These instructions are to help you care for yourself when you go home.  Call: If you have any problems. Call [phone] and ask for the surgeon on call If you need immediate help, come to the ER at [place].  Tell the ER staff that you are a new post-op gastric bypass or gastric sleeve patient   Signs and symptoms to report: Severe vomiting or nausea If you cannot keep down clear liquids for longer than 1 day, call your surgeon  Abdominal pain that does not get better after taking your pain medication Fever over 100.4 F with chills Heart beating over 100 beats a minute Shortness of breath at rest Chest pain  Redness, swelling, drainage, or foul odor at incision (surgical) sites  If your incisions open or pull apart Swelling or pain in calf (lower leg) Diarrhea (Loose bowel movements that happen often), frequent watery, uncontrolled bowel movements Constipation, (no bowel movements for 3 days) if this happens: Pick one Milk of Magnesia, 2 tablespoons by mouth, 3 times a day for 2 days if needed Stop taking Milk of Magnesia once you have a bowel movement Call your doctor if constipation continues Or Miralax  (instead of Milk of Magnesia) following the label instructions Stop taking Miralax once you have a bowel movement Call your doctor if constipation continues Anything you think is not normal   Normal side effects after surgery: Unable to sleep at night or unable to focus Irritability or moody Being tearful (crying) or depressed These are common complaints, possibly related to your anesthesia medications that put you to sleep, stress of surgery, and change in lifestyle.  This usually goes away a few weeks after surgery.  If these feelings continue, call your primary care doctor.   Wound Care: You may have surgical glue, steri-strips, or staples over your incisions after surgery Surgical glue:  Looks like a clear film  over your incisions and will wear off a little at a time Steri-strips: Strips of tape over your incisions. You may notice a yellowish color on the skin under the steri-strips. This is used to make the   steri-strips stick better. Do not pull the steri-strips off - let them fall off Staples: Staples may be removed before you leave the hospital If you go home with staples, call [facility], [phone] at for an appointment with your surgeon's nurse to have staples removed 10 days after surgery. Showering: You may shower two (2) days after your surgery unless your surgeon tells you differently Wash gently around incisions with warm soapy water, rinse well, and gently pat dry  No tub baths until staples are removed, steri-strips fall off or glue is gone.    Medications: Medications should be liquid or crushed if larger than the size of a dime Extended release pills (medication that release a little bit at a time through the day) should NOT be crushed or cut. (examples include XL, ER, DR, SR) Depending on the size and number of medications you take, you may need to space (take a few throughout the day)/change the time you take your medications so that you do not over-fill your pouch (smaller stomach) Make sure you follow-up with your primary care doctor to make medication changes needed during rapid weight loss and life-style changes If you have diabetes, follow up with the doctor that orders your diabetes medication(s) within one week after surgery and check your blood sugar regularly.   Do not drive while taking prescription pain medication  It is ok to take Tylenol by the bottle instructions with your pain medicine or instead of your pain medicine as needed.  DO NOT TAKE NSAIDS (EXAMPLES OF NSAIDS:  IBUPROFREN/ NAPROXEN)  Diet:                    First 2 Weeks  You will see the dietician t about two (2) weeks after your surgery. The dietician will increase the types of foods you can eat  if you are handling liquids well: If you have severe vomiting or nausea and cannot keep down clear liquids lasting longer than 1 day, call your surgeon @ ([phone]) Protein Shake Drink at least 2 ounces of shake 5-6 times per day Each serving of protein shakes (usually 8 - 12 ounces) should have: 15 grams of protein  And no more than 5 grams of carbohydrate  Goal for protein each day: Men = 80 grams per day Women = 60 grams per day Protein powder may be added to fluids such as non-fat milk or Lactaid milk or unsweetened Soy/Almond milk (limit to 35 grams added protein powder per serving)  Hydration Slowly increase the amount of water and other clear liquids as tolerated (See Acceptable Fluids) Slowly increase the amount of protein shake as tolerated   Sip fluids slowly and throughout the day.  Do not use straws. May use sugar substitutes in small amounts (no more than 6 - 8 packets per day; i.e. Splenda)  Fluid Goal The first goal is to drink at least 8 ounces of protein shake/drink per day (or as directed by the nutritionist); some examples of protein shakes are Syntrax Nectar, Adkins Advantage, EAS Edge HP, and Unjury. See handout from pre-op Bariatric Education Class: Slowly increase the amount of protein shake you drink as tolerated You may find it easier to slowly sip shakes throughout the day It is important to get your proteins in first Your fluid goal is to drink 64 - 100 ounces of fluid daily It may take a few weeks to build up to this 32 oz (or more) should be clear liquids  And  32 oz (or more) should be full liquids (see below for examples) Liquids should not contain sugar, caffeine, or carbonation  Clear Liquids: Water or Sugar-free flavored water (i.e. Fruit H2O, Propel) Decaffeinated coffee or tea (sugar-free) Crystal Lite, Wyler's Lite, Minute Maid Lite Sugar-free Jell-O Bouillon or broth Sugar-free Popsicle:   *Less than 20 calories each; Limit 1 per  day  Full Liquids: Protein Shakes/Drinks + 2 choices per day of other full liquids Full liquids must be: No More Than 15 grams of Carbs per serving  No More Than 3 grams of Fat per serving Strained low-fat cream soup (except Cream of Potato or Tomato) Non-Fat milk Fat-free Lactaid Milk Unsweetened Soy Or Unsweetened Almond Milk Low Sugar yogurt (Dannon Lite & Fit, Greek yogurt; Oikos Triple Zero; Chobani Simply 100; Yoplait 100 calorie Greek - No Fruit on the Bottom)    Vitamins and Minerals Start 1 day after surgery unless otherwise directed by your surgeon 2 Chewable Bariatric Specific Multivitamin / Multimineral Supplement with iron (Example: Bariatric Advantage Multi EA) Chewable Calcium with Vitamin D-3 (Example: 3 Chewable Calcium Plus 600 with Vitamin D-3) Take 500 mg three (3) times a day for a total of 1500 mg each day Do not take all 3 doses of calcium at one time as it may cause constipation, and   you can only absorb 500 mg  at a time  Do not mix multivitamins containing iron with calcium supplements; take 2 hours apart Menstruating women and those with a history of anemia (a blood disease that causes weakness) may need extra iron Talk with your doctor to see if you need more iron Do not stop taking or change any vitamins or minerals until you talk to your dietitian or surgeon Your Dietitian and/or surgeon must approve all vitamin and mineral supplements   Activity and Exercise: Limit your physical activity as instructed by your doctor.  It is important to continue walking at home.  During this time, use these guidelines: Do not lift anything greater than ten (10) pounds for at least two (2) weeks Do not go back to work or drive until your surgeon says you can You may have sex when you feel comfortable  It is VERY important for female patients to use a reliable birth control method; fertility often increases after surgery  All hormonal birth control will be ineffective for 30  days after surgery due to medications given during surgery a barrier method must be used. Do not get pregnant for at least 18 months Start exercising as soon as your doctor tells you that you can Make sure your doctor approves any physical activity Start with a simple walking program Walk 5-15 minutes each day, 7 days per week.  Slowly increase until you are walking 30-45 minutes per day Consider joining our BELT program. [phone] or email [email or web address]   Special Instructions Things to remember: Use your CPAP when sleeping if this applies to you  [facility] has two free Bariatric Surgery Support Groups that meet monthly The 3rd Thursday of each month, 6 pm, [facility] [facility]  The 2nd Friday of each month, 11:45 am in the private dining room in the basement of [place] It is very important to keep all follow up appointments with your surgeon, dietitian, primary care physician, and behavioral health practitioner Routine follow up schedule with your surgeon include appointments at 2-3 weeks, 6-8 weeks, 6 months, and 1 year at a minimum.  Your surgeon may request to see you more often.   After the first year, please follow up with your bariatric surgeon and dietitian at least once a year in order to maintain best weight loss results [facility]: [phone] [facility]: [phone] Bariatric Nurse Coordinator: [phone]      Reviewed and Endorsed  by  Patient Education Committee, June, 2016 Edits Approved: Aug, 2018    

## 2021-05-07 NOTE — Progress Notes (Signed)
Patient alert and oriented, pain is controlled. Patient is tolerating fluids, advanced to protein shake today, patient is tolerating well. Reviewed Gastric Bypass discharge instructions with patient and patient is able to articulate understanding. Provided information on BELT program, Support Group and [facility] outpatient pharmacy. All questions answered, will continue to monitor.   Fluid intake 600 cc

## 2021-05-07 NOTE — Progress Notes (Signed)
Transition of Care (TOC) Screening Note  Patient Details  Name: Catherine Shah Date of Birth: 1985/07/27  Transition of Care [facility] CM/SW Contact:    Ewing Schlein, LCSW Phone Number: 05/07/2021, 8:49 AM  Transition of Care Department [facility]) has reviewed patient and no TOC needs have been identified at this time. We will continue to monitor patient advancement through interdisciplinary progression rounds. If new patient transition needs arise, please place a TOC consult.

## 2021-05-07 NOTE — Progress Notes (Signed)
Patient was given discharge instructions, and all questions were answered.  Patient was stable for discharge and was taken to the main exit by wheelchair. 

## 2021-05-07 NOTE — Discharge Summary (Signed)
Physician Discharge Summary  Catherine Shah NOB:[id number] DOB: 1985-11-28 DOA: 05/06/2021  PCP: Joaquim Nam, MD  Admit date: 05/06/2021 Discharge date: 05/07/2021  Recommendations for Outpatient Follow-up:   (include homehealth, outpatient follow-up instructions, specific recommendations for PCP to follow-up on, etc.)   Discharge Diagnoses:  Principal Problem:   Morbid (severe) obesity due to excess calories [facility])   Surgical Procedure: laparoscopic sleeve gastrectomy, upper endoscopy  Discharge Condition: Good Disposition: Home  Diet recommendation: Postoperative sleeve gastrectomy diet (liquids only)  Filed Weights   05/06/21 1025  Weight: 114.3 kg     Hospital Course:  The patient was admitted after undergoing laparoscopic sleeve gastrectomy. POD 0 she ambulated well. POD 1 she was started on the water diet protocol and tolerated 300 ml in the first shift. Once meeting the water amount she was advanced to bariatric protein shakes which they tolerated and were discharged home POD 1.  Treatments: surgery: laparoscopic sleeve gastrectomy  Discharge Instructions  Discharge Instructions     Ambulate hourly while awake   Complete by: As directed    Call MD for:  difficulty breathing, headache or visual disturbances   Complete by: As directed    Call MD for:  persistant dizziness or light-headedness   Complete by: As directed    Call MD for:  persistant nausea and vomiting   Complete by: As directed    Call MD for:  redness, tenderness, or signs of infection (pain, swelling, redness, odor or green/yellow discharge around incision site)   Complete by: As directed    Call MD for:  severe uncontrolled pain   Complete by: As directed    Call MD for:  temperature >101 F   Complete by: As directed    Diet bariatric full liquid   Complete by: As directed    Discharge wound care:   Complete by: As directed    Remove Bandaids tomorrow, ok to shower tomorrow. Steristrips may  fall off in 1-3 weeks.   Incentive spirometry   Complete by: As directed    Perform hourly while awake      Allergies as of 05/07/2021       Reactions   Tape Other (See Comments)   sensitivity        Medication List     STOP taking these medications    metroNIDAZOLE 500 MG tablet Commonly known as: FLAGYL       TAKE these medications    acetaminophen 500 MG tablet Commonly known as: TYLENOL Take 2 tablets (1,000 mg total) by mouth every 8 (eight) hours for 5 days.   Advil Cold/Sinus 30-200 MG Tabs Generic drug: Pseudoephedrine-Ibuprofen Take 1 tablet by mouth 2 (two) times daily as needed (cold symptoms).   BARIATRIC MULTIVITAMINS/IRON PO Take 1 tablet by mouth in the morning and at bedtime.   buPROPion 150 MG 12 hr tablet Commonly known as: WELLBUTRIN SR Take 300 mg by mouth every morning.   CALCIUM 500/VITAMIN D PO Take 1 tablet by mouth in the morning, at noon, and at bedtime.   citalopram 40 MG tablet Commonly known as: CELEXA Take 40 mg by mouth daily.   fluticasone 50 MCG/ACT nasal spray Commonly known as: FLONASE Place 2 sprays into both nostrils daily. What changed:  when to take this reasons to take this   gabapentin 800 MG tablet Commonly known as: NEURONTIN Take 800 mg by mouth at bedtime.   gabapentin 100 MG capsule Commonly known as: NEURONTIN Take 100 mg by mouth 3 (  three) times daily as needed (pain).   ipratropium 0.03 % nasal spray Commonly known as: ATROVENT Place 2 sprays into both nostrils every 12 (twelve) hours. What changed:  when to take this reasons to take this   NuvaRing 0.12-0.015 MG/24HR vaginal ring Generic drug: etonogestrel-ethinyl estradiol Insert vaginally and leave in place for 3 consecutive weeks, then remove for 1 week.   ondansetron 4 MG disintegrating tablet Commonly known as: ZOFRAN-ODT Take 1 tablet (4 mg total) by mouth every 6 (six) hours as needed for nausea or vomiting.   oxyCODONE 5 MG  immediate release tablet Commonly known as: Oxy IR/ROXICODONE Take 1 tablet (5 mg total) by mouth every 6 (six) hours as needed for severe pain.   pantoprazole 40 MG tablet Commonly known as: PROTONIX Take 1 tablet (40 mg total) by mouth daily.               Discharge Care Instructions  (From admission, onward)           Start     Ordered   05/07/21 0000  Discharge wound care:       Comments: Remove Bandaids tomorrow, ok to shower tomorrow. Steristrips may fall off in 1-3 weeks.   05/07/21 0806              The results of significant diagnostics from this hospitalization (including imaging, microbiology, ancillary and laboratory) are listed below for reference.    Significant Diagnostic Studies: No results found.  Labs: Basic Metabolic Panel: Recent Labs  Lab 05/07/21 0421  NA 136  K 4.2  CL 108  CO2 22  GLUCOSE 127*  BUN 10  CREATININE 0.73  CALCIUM 8.5*   Liver Function Tests: Recent Labs  Lab 05/07/21 0421  AST 19  ALT 28  ALKPHOS 64  BILITOT 0.4  PROT 6.7  ALBUMIN 3.8    CBC: Recent Labs  Lab 05/06/21 1359 05/07/21 0421  WBC  --  9.4  NEUTROABS  --  7.6  HGB 12.3 12.0  HCT 36.5 36.1  MCV  --  88.5  PLT  --  270    CBG: No results for input(s): GLUCAP in the last 168 hours.  Principal Problem:   Morbid (severe) obesity due to excess calories (HCC)   VTE plan: no chemical prophylaxis recommended ([email or web address])  Time coordinating discharge: 15 min

## 2021-05-08 NOTE — Progress Notes (Signed)
24hr fluid recall prior to discharge: .  Per dehydration protocol, will call pt to f/u within two weeks post op.

## 2021-05-09 ENCOUNTER — Telehealth (HOSPITAL_COMMUNITY): Payer: Self-pay | Admitting: *Deleted

## 2021-05-09 NOTE — Telephone Encounter (Signed)
1.  Tell me about your pain and pain management? ?Pt c/o right lower abdominal incisional pain with movement and exertion.  Discussed with patient to try and splint her abdomen with changing positions to assist with the discomfort.  Encouraged pt to try options and/or contact [facility] if still concerned.  ? ?2.  Let's talk about fluid intake.  How much total fluid are you taking in? ?Pt states that she is getting in at least 64oz of fluid including protein shakes, bottled water, and Gatorade. Pt instructed to assess status and suggestions daily utilizing Hydration Action Plan on discharge folder and to call [facility] if in the "red zone".  ? ?3.  How much protein have you taken in the last 2 days? ?Pt states she is meeting her goal of 60g of protein each day with the protein shakes. ? ?4.  Have you had nausea?  Tell me about when have experienced nausea and what you did to help? ?Pt denies nausea. ?  ?5.  Has the frequency or color changed with your urine? ?Pt states that she is urinating "fine" with no changes in frequency or urgency.   ?  ?6.  Tell me what your incisions look like? ?"Incisions look fine". Pt denies a fever, chills.  Pt states incisions are not swollen, open, or draining.  Pt encouraged to call [facility] if incisions change. ?  ?7.  Have you been passing gas? BM? ?Pt states that she is having BMs. Last BM 05/08/21.   ?  ?8.  If a problem or question were to arise who would you call?  Do you know contact numbers for [facility], [facility], and [facility]? ?Pt denies dehydration symptoms.  Pt can describe s/sx of dehydration.  Pt knows to call [facility] for surgical, [facility] for nutrition, and [facility] for non-urgent questions or concerns. ?  ?9.  How has the walking going? ?Pt states she is walking around and able to be active without difficulty. ?  ?10. Are you still using your incentive spirometer?  If so, how often? ?Pt encouraged to use incentive spirometer, at least 10x every hour while awake until she sees the surgeon. ? ?11.  How are your  vitamins and calcium going?  How are you taking them? ?Pt states that she is taking her supplements and vitamins without difficulty. ? ?Reminded patient that the first 30 days post-operatively are important for successful recovery.  Practice good hand hygiene, wearing a mask when appropriate (since optional in most places), and minimizing exposure to people who live outside of the home, especially if they are exhibiting any respiratory, GI, or illness-like symptoms.   ? ?

## 2021-05-14 DIAGNOSIS — F331 Major depressive disorder, recurrent, moderate: Secondary | ICD-10-CM | POA: Diagnosis not present

## 2021-05-20 ENCOUNTER — Other Ambulatory Visit: Payer: Self-pay

## 2021-05-20 ENCOUNTER — Encounter: Payer: BC Managed Care – PPO | Attending: General Surgery | Admitting: Dietician

## 2021-05-20 ENCOUNTER — Encounter: Payer: Self-pay | Admitting: Dietician

## 2021-05-20 VITALS — Ht 64.0 in | Wt 238.8 lb

## 2021-05-20 DIAGNOSIS — Z6841 Body Mass Index (BMI) 40.0 and over, adult: Secondary | ICD-10-CM

## 2021-05-20 NOTE — Patient Instructions (Signed)
Start including some solid protein foods that are low in fat, tender, and moist in texture.  ?Allow up to 30 minutes to eat. Stop drinks 15 minutes before and don't resume until at least 30 minutes after finishing a meal.  ?Allow 2-3 hours between solid meals and snacks. ?Continue to gradually increase physical activity as tolerated and cleared by your surgeon. ?

## 2021-05-20 NOTE — Progress Notes (Signed)
Nutrition Therapy for Post-Operative Bariatric Diet ?Follow-up visit:  2 weeks post-op sleeve gastrectomy Surgery ? ?Medical Nutrition Therapy:  Appt start time: 1445 end time:  1515 ? ?Anthropometrics: ? ?Date 10/03/20 03/29/21 05/20/21  ?BMI 43.7 44.1 40.99  ?Weight (lbs) 254.6 257.1 238.8  ?Skeletal muscle (lbs)  71 65.7  ?% body fat  50.7 50.5  ? ?Clinical: ?Medications: ALPRAZolam prn, buPROPion, citalopram, gabapentin, nuvaring, pantoprazole ?Supplementation: bariatric chewable multivitamin 2x daily, calcium carbonate 500mg  3x daily ? ?Health/ medical history changes: no changes ?GI symptoms: N/V/D/C: occasional mild nausea when taking multivitamin; constipation soon after surgery, now resolved, one episode of diarrhea also resolved. ?Dumping Syndrome: no ?Hair loss: no ? ?Dietary/ Lifestyle Progress: ?Patient reports doing well during and since surgery. She has had little pain or GI issues.  ?She reports growing weary of protein shakes and has been incorporating a bariatric high-protein soup and having some broth with added protein powder for variety. She voices readiness to resume some solid foods. ?She has been feeling some hunger recently  ? ?Dietary recall: ?Eating pattern: protein soup mix, protein shakes Premier brand, diluted with milk ?Dining out: none ?Breakfast: protein shake ?Snack: same as evening  ?Lunch: proitein shake ?Snack: yogurt  ?Dinner: protein soup or broth with added protein ?Snack: sugar free popsicle, jello ? ?Fluid intake: water, decaf sugar free tea, protein shakes, soup; >64oz daily ?Estimated total protein intake: 80g ?Bariatric diet adherence:  ?Using straws: yes, denies any issues ?Drinking fluids during meals: n/a ?Carbonated beverages: no ? ?Recent physical activity:  some outdoor walking 2-5x a week, increased general daily movement ? ? ?Nutrition Intervention:   ?Reviewed progress since surgery. ?Instructed on advancing diet to include solid protein foods that are lean, tender,  and moist. Reviewed importance of small bites and chewing thoroughly. ?Advised slow eating to monitor physical satiety cues.  ?Reviewed need to avoid fluids 15 min prior, during, and 30 min after eating. ?Discussed monitoring protein and fluid intake especially when transitioning to next diet phase. ? ?Nutritional Diagnosis:  ?[place]-3.3 Overweight/obesity As related to history of excess calories and inadequate physical activity.  As evidenced by patient with current BMI of 41, following bariatric diet guidelines for ongoing weight loss after sleeve gastrectomy procedure. ? ?Teaching Method Utilized:  ?Visual ?Auditory ?Hands on ? ?Materials provided: ?Phase 3 and 4 bariatric diet handouts ?Visit summary with goals/ instructions ? ?Learning Readiness:  ?Change in progress ? ?Barriers to learning/adherence to lifestyle change: none ? ?Demonstrated degree of understanding via:  Teach Back  ?   ? ?Plan: ?Work on goals as listed ?Return for follow up on 07/04/21 at 5:15pm ?  ?

## 2021-05-27 DIAGNOSIS — F331 Major depressive disorder, recurrent, moderate: Secondary | ICD-10-CM | POA: Diagnosis not present

## 2021-05-27 DIAGNOSIS — F4323 Adjustment disorder with mixed anxiety and depressed mood: Secondary | ICD-10-CM | POA: Diagnosis not present

## 2021-05-27 DIAGNOSIS — F411 Generalized anxiety disorder: Secondary | ICD-10-CM | POA: Diagnosis not present

## 2021-05-31 DIAGNOSIS — F331 Major depressive disorder, recurrent, moderate: Secondary | ICD-10-CM | POA: Diagnosis not present

## 2021-06-04 DIAGNOSIS — F331 Major depressive disorder, recurrent, moderate: Secondary | ICD-10-CM | POA: Diagnosis not present

## 2021-06-11 ENCOUNTER — Encounter: Payer: Self-pay | Admitting: Family Medicine

## 2021-06-11 ENCOUNTER — Ambulatory Visit: Payer: BC Managed Care – PPO | Admitting: Family Medicine

## 2021-06-11 ENCOUNTER — Other Ambulatory Visit (HOSPITAL_COMMUNITY)
Admission: RE | Admit: 2021-06-11 | Discharge: 2021-06-11 | Disposition: A | Discharge status: Home / Self Care | Payer: BC Managed Care – PPO | Source: Ambulatory Visit | Attending: Family Medicine | Admitting: Family Medicine

## 2021-06-11 VITALS — BP 122/80 | HR 88 | Temp 97.1°F | Ht 64.0 in | Wt 231.0 lb

## 2021-06-11 DIAGNOSIS — Z113 Encounter for screening for infections with a predominantly sexual mode of transmission: Secondary | ICD-10-CM

## 2021-06-11 DIAGNOSIS — Z903 Acquired absence of stomach [part of]: Secondary | ICD-10-CM | POA: Diagnosis not present
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
## 2021-06-11 NOTE — Patient Instructions (Addendum)
Go to the lab on the way out.   If you have mychart we'll likely use that to update you.    ?Take care.  Glad to see you. ?Thanks for you effort.   ?

## 2021-06-12 DIAGNOSIS — Z903 Acquired absence of stomach [part of]: Secondary | ICD-10-CM | POA: Insufficient documentation

## 2021-06-12 DIAGNOSIS — Z113 Encounter for screening for infections with a predominantly sexual mode of transmission: Secondary | ICD-10-CM | POA: Insufficient documentation

## 2021-06-12 LAB — RPR: RPR Ser Ql: NONREACTIVE

## 2021-06-12 LAB — HIV ANTIBODY (ROUTINE TESTING W REFLEX): HIV 1&2 Ab, 4th Generation: NONREACTIVE

## 2021-06-12 LAB — URINE CYTOLOGY ANCILLARY ONLY
Chlamydia: NEGATIVE
Comment: NEGATIVE
Comment: NEGATIVE
Comment: NORMAL
Neisseria Gonorrhea: NEGATIVE
Trichomonas: NEGATIVE
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
## 2021-06-12 NOTE — Assessment & Plan Note (Signed)
Following up with surgery clinic.  She is meeting her dietary goals.  Weight loss noted.  Continue as is.  She will update me as needed.  I thanked her for her effort. ?

## 2021-06-14 DIAGNOSIS — F331 Major depressive disorder, recurrent, moderate: Secondary | ICD-10-CM | POA: Diagnosis not present

## 2021-06-21 DIAGNOSIS — F331 Major depressive disorder, recurrent, moderate: Secondary | ICD-10-CM | POA: Diagnosis not present

## 2021-07-03 DIAGNOSIS — Z9884 Bariatric surgery status: Secondary | ICD-10-CM | POA: Diagnosis not present

## 2021-07-03 DIAGNOSIS — E569 Vitamin deficiency, unspecified: Secondary | ICD-10-CM | POA: Diagnosis not present

## 2021-07-03 DIAGNOSIS — K912 Postsurgical malabsorption, not elsewhere classified: Secondary | ICD-10-CM | POA: Diagnosis not present

## 2021-07-04 ENCOUNTER — Encounter: Payer: Self-pay | Admitting: Dietician

## 2021-07-04 ENCOUNTER — Encounter: Payer: BC Managed Care – PPO | Attending: General Surgery | Admitting: Dietician

## 2021-07-04 VITALS — Ht 64.0 in | Wt 223.3 lb

## 2021-07-04 DIAGNOSIS — E669 Obesity, unspecified: Secondary | ICD-10-CM | POA: Insufficient documentation

## 2021-07-04 DIAGNOSIS — Z6838 Body mass index (BMI) 38.0-38.9, adult: Secondary | ICD-10-CM | POA: Diagnosis not present

## 2021-07-04 NOTE — Progress Notes (Signed)
Nutrition Therapy for Post-Operative Bariatric Diet ?Follow-up visit:  2 months post-op Sleeve gastrectomy Surgery ? ?Medical Nutrition Therapy:  Appt start time: 1715 end time:  1745 ? ?Anthropometrics: ? ?Date 10/03/21 03/29/21 05/20/21 07/04/21  ?BMI 43.7 44.1 40.99 38.3  ?Weight (lbs) 254.6 257.1 238.8 223.3  ?Skeletal muscle (lbs)  71 65.7 65.3  ?% body fat  50.7 50.5 47.2  ? ?Clinical: ?Medications: ALPRAZolam prn, buPROPion, citalopram, gabapentin, nuvaring, pantoprazole ?Supplementation: bariatric chewable multivitamin 2x daily, calcium carbonate 500mg  3x daily ?Supplementation: bariatric multivitamin ?Health/ medical history changes: no changes ?GI symptoms: N/V/D/C: occasional diarrhea from protein shakes ?Dumping Syndrome: no ?Hair loss: no ? ?Dietary/ Lifestyle Progress: ?Patient reports doing well, increasing physical activity with BELT program, which is reflected in reduction in body fat percentage; she also reports clothing fitting more loosely. ? ? ?Dietary recall: ?Eating pattern: 3 meals + 1-2 snacks daily ?Dining out: occasional ?Breakfast: protein shake; eggs occ with sausage ?Snack: none or same as pm  ?Lunch: often fish ie tuna pouch, able to finish ?Snack: yogurt , cheese stick ?Dinner: salmon + veg; hibach chicken and veg without rice ?Snack: sugar free popsicles ? ?Fluid intake: water 32oz 2-3x daily, sugar free tea, protein shake -- 75-90oz daily ?Estimated total protein intake: 65-75g ?Bariatric diet adherence:  ?Using straws: yes, denies any issues ?Drinking fluids during meals: small sips only as needed ?Carbonated beverages: no ? ?Recent physical activity:  BELT program 3x weekly ? ? ?Nutrition Intervention:   ?Reviewed progress since previous visit on 05/20/21 ?Discussed advancing diet to include more low carb vegetables with meals and/or snacks ?Reviewed importance of eating protein first, then low carb veg., then gradually adding other high fiber carbs ie sweet potato, peas,  berries/ other fruits. ?Discussed ongoing avoidance of alcohol and potential increased effects and risks. Patient would like to be able to have a drink for a birthday celebration in another few months which would be more than 6 months post op. Advised a few sips, or splitting a drink with someone.  ? ?Nutritional Diagnosis:  ?[place]-3.3 Overweight/obesity As related to history of excess calories and inadequate physical activity.  As evidenced by patient with current BMI of 38, following bariatric diet guidelines for ongoing weight loss after sleeve gastrectomy procedure. ? ?Teaching Method Utilized:  ?Visual ?Auditory ?Hands on ? ?Materials provided: ?Phase 4 and 5 bariatric diet handouts ?Visit summary with goals/ instructions ? ?Learning Readiness:  ?Change in progress ? ?Barriers to learning/adherence to lifestyle change: none ? ?Demonstrated degree of understanding via:  Teach Back  ?   ? ?Plan: ?Return for body composition measurement in about 1 month, 08/12/21 at 5:15pm ?Schedule next MNT visit for 6 months post-op, around 11/03/21. ?  ?

## 2021-07-04 NOTE — Patient Instructions (Signed)
Continue to gradually add more low carb veggies.  ?Concentrate on lean proteins and low carb veggies, and after a few more weeks, its ok to add small portions of peas, sweet potato, berries, or other fruits (no added sugar) only after eating the protein and veggies in a meal. ?

## 2021-07-11 ENCOUNTER — Ambulatory Visit (INDEPENDENT_AMBULATORY_CARE_PROVIDER_SITE_OTHER): Payer: BC Managed Care – PPO | Admitting: Nurse Practitioner

## 2021-07-11 ENCOUNTER — Encounter: Payer: Self-pay | Admitting: Nurse Practitioner

## 2021-07-11 VITALS — BP 122/82

## 2021-07-11 DIAGNOSIS — R6889 Other general symptoms and signs: Secondary | ICD-10-CM | POA: Diagnosis not present

## 2021-07-11 DIAGNOSIS — N907 Vulvar cyst: Secondary | ICD-10-CM

## 2021-07-11 DIAGNOSIS — N764 Abscess of vulva: Secondary | ICD-10-CM

## 2021-07-11 MED ORDER — SULFAMETHOXAZOLE-TRIMETHOPRIM 400-80 MG PO TABS: 1.0000 | ORAL_TABLET | Freq: Two times a day (BID) | ORAL | 0 refills | Status: AC

## 2021-07-11 NOTE — Progress Notes (Signed)
? ?  Acute Office Visit ? ?Subjective:  ? ? Patient ID: Eyanna Mcgonagle, female    DOB: [birth year], 36 y.o.   MRN: [id number] ? ? ?HPI ?36 y.o. presents today for left labial cyst. She first noticed it about a week ago. It has increased in size and is painful. She has done warm soaks. She has had bartholin cysts in the past on the opposite side that have always resolved on their own.  ? ? ?Review of Systems  ?Constitutional: Negative.   ?Genitourinary:   ?     Left labial cyst/abscess  ?Skin:   ?     Vulvar bump  ? ?   ?Objective:  ?  ?Physical Exam ?Constitutional:   ?   Appearance: Normal appearance.  ?Genitourinary: ? ? ? ? ?BP 122/82   LMP 06/20/2021  ?Wt Readings from Last 3 Encounters:  ?07/04/21 223 lb 4.8 oz (101.3 kg)  ?06/11/21 231 lb (104.8 kg)  ?05/20/21 238 lb 12.8 oz (108.3 kg)  ? ? ?   ? ?Patient informed chaperone available to be present for breast and pelvic exam. Patient has requested no chaperone to be present. Patient has been advised what will be completed during breast and pelvic exam.  ? ?Assessment & Plan:  ? ?Problem List Items Addressed This Visit   ?None ?Visit Diagnoses   ? ? Suspected soft tissue infection    -  Primary  ? Relevant Medications  ? sulfamethoxazole-trimethoprim (BACTRIM) 400-80 MG tablet  ? Other Relevant Orders  ? Wound culture  ? Left genital labial abscess      ? Relevant Orders  ? Wound culture  ? ?  ? ?Plan: Not bartholin cyst. Unable to drain with digital pressure due to patient discomfort. Discussed risks and benefits of the I & D procedure, as well as the alternatives. Verbal consent obtained from patient.  ? ?Incision & drainage performed of fluid-filled cyst/abscess of left labia. The area was prepped, cleaned with betadine, and locally anesthetized. #11 scalpel used to make surgical incision over the most fluctuant area. I then expressed any fluid that I could. Fluid yellow/bloody. Area cleaned and no dressing required. Pad provided for any leftover drainage.  ? ?Keep  area clean and dry. Warm sitz baths. Bactrim 400-80-mg BID x 10 days. Culture pending.  ? ? ? ? ? ?Olivia Mackie DNP, 9:13 AM 07/11/2021 ? ?

## 2021-07-14 LAB — WOUND CULTURE
MICRO NUMBER:: 13351819
SPECIMEN QUALITY:: ADEQUATE

## 2021-07-18 DIAGNOSIS — F331 Major depressive disorder, recurrent, moderate: Secondary | ICD-10-CM | POA: Diagnosis not present

## 2021-07-26 DIAGNOSIS — F331 Major depressive disorder, recurrent, moderate: Secondary | ICD-10-CM | POA: Diagnosis not present

## 2021-08-01 DIAGNOSIS — F331 Major depressive disorder, recurrent, moderate: Secondary | ICD-10-CM | POA: Diagnosis not present

## 2021-08-02 ENCOUNTER — Encounter: Payer: Self-pay | Admitting: Medical Oncology

## 2021-08-02 ENCOUNTER — Emergency Department: Payer: BC Managed Care – PPO

## 2021-08-02 ENCOUNTER — Emergency Department
Admission: EM | Admit: 2021-08-02 | Discharge: 2021-08-02 | Disposition: A | Discharge status: Home / Self Care | Payer: BC Managed Care – PPO | Attending: Emergency Medicine | Admitting: Emergency Medicine

## 2021-08-02 DIAGNOSIS — M25531 Pain in right wrist: Secondary | ICD-10-CM

## 2021-08-02 DIAGNOSIS — M7989 Other specified soft tissue disorders: Secondary | ICD-10-CM | POA: Diagnosis not present

## 2021-08-02 DIAGNOSIS — S60211A Contusion of right wrist, initial encounter: Secondary | ICD-10-CM | POA: Insufficient documentation

## 2021-08-02 DIAGNOSIS — Y92410 Unspecified street and highway as the place of occurrence of the external cause: Secondary | ICD-10-CM | POA: Diagnosis not present

## 2021-08-02 DIAGNOSIS — S6991XA Unspecified injury of right wrist, hand and finger(s), initial encounter: Secondary | ICD-10-CM | POA: Diagnosis not present

## 2021-08-02 DIAGNOSIS — M542 Cervicalgia: Secondary | ICD-10-CM | POA: Insufficient documentation

## 2021-08-02 DIAGNOSIS — R58 Hemorrhage, not elsewhere classified: Secondary | ICD-10-CM | POA: Diagnosis not present
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
## 2021-08-02 NOTE — Discharge Instructions (Signed)
Please wear the splint.  Follow-up in a week with either Dr. Allena Katz the orthopedic surgeon or you could return here for recheck.  If you develop any other aches or pains that are worrisome to you please do not hesitate to return.  Refer remember Tylenol or Advil as needed.

## 2021-08-05 ENCOUNTER — Telehealth: Payer: Self-pay | Admitting: Family Medicine
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
## 2021-08-07 NOTE — Telephone Encounter (Signed)
Noted. Will await ortho report.  Thanks.

## 2021-08-07 NOTE — Telephone Encounter (Signed)
Called and spoke with patient. She is doing okay. Will see Ortho on Tuesday.

## 2021-08-08 DIAGNOSIS — F331 Major depressive disorder, recurrent, moderate: Secondary | ICD-10-CM | POA: Diagnosis not present

## 2021-08-13 DIAGNOSIS — M25531 Pain in right wrist: Secondary | ICD-10-CM | POA: Diagnosis not present

## 2021-08-13 DIAGNOSIS — G8929 Other chronic pain: Secondary | ICD-10-CM | POA: Diagnosis not present

## 2021-08-13 DIAGNOSIS — S63501A Unspecified sprain of right wrist, initial encounter: Secondary | ICD-10-CM | POA: Diagnosis not present

## 2021-08-13 DIAGNOSIS — M542 Cervicalgia: Secondary | ICD-10-CM | POA: Diagnosis not present

## 2021-08-13 DIAGNOSIS — S161XXA Strain of muscle, fascia and tendon at neck level, initial encounter: Secondary | ICD-10-CM | POA: Diagnosis not present

## 2021-08-14 DIAGNOSIS — F331 Major depressive disorder, recurrent, moderate: Secondary | ICD-10-CM | POA: Diagnosis not present

## 2021-08-14 DIAGNOSIS — F4323 Adjustment disorder with mixed anxiety and depressed mood: Secondary | ICD-10-CM | POA: Diagnosis not present

## 2021-08-14 DIAGNOSIS — F411 Generalized anxiety disorder: Secondary | ICD-10-CM | POA: Diagnosis not present

## 2021-08-18 DIAGNOSIS — F331 Major depressive disorder, recurrent, moderate: Secondary | ICD-10-CM | POA: Diagnosis not present

## 2021-09-27 DIAGNOSIS — F331 Major depressive disorder, recurrent, moderate: Secondary | ICD-10-CM | POA: Diagnosis not present

## 2021-11-06 ENCOUNTER — Encounter: Payer: BC Managed Care – PPO | Attending: General Surgery | Admitting: Dietician

## 2021-11-06 ENCOUNTER — Encounter: Payer: Self-pay | Admitting: Dietician

## 2021-11-06 VITALS — Ht 64.0 in | Wt 199.5 lb

## 2021-11-06 DIAGNOSIS — E669 Obesity, unspecified: Secondary | ICD-10-CM | POA: Diagnosis not present

## 2021-11-06 NOTE — Patient Instructions (Signed)
Make use of bariatric menus, partially homemade meals, ideas from baritastic or other healthy meal resources, to ensure balanced and adequate nutritional intake.  Keep up regular exercise, great job!.  Look for suitable multivitamin that includes most of the necessary amounts of nutrients needed after surgery.

## 2021-11-06 NOTE — Progress Notes (Signed)
Nutrition Therapy for Post-Operative Bariatric Diet Follow-up visit:  6 months post-op sleeve gastrectomy surgery  Medical Nutrition Therapy:  Appt start time: 1320 end time:  1350  Anthropometrics:  Date 10/03/21 03/29/21 05/20/21 07/04/21 11/06/21  BMI 43.7 44.1 40.99 38.3 34.2  Weight (lbs) 254.6 257.1 238.8 223.3 199.5  Skeletal muscle (lbs)   71 65.7 65.3 64.6  % body fat   50.7 50.5 47.2 41.3    Clinical: Medications: buPROPion, Citalopram, gabapentin, nuvaring Supplementation: bariatric multivitamin sporadically (causes nausea) + calcium 500mg  3x daily Health/ medical history changes: no chnage GI symptoms: N/V/D/C: occasional diarrhea when drinking protein shakes; occ nausea and/or diarrhea when eating too fast, not chewing well; no constipation Dumping Syndrome: no Hair loss: yes in past 2 months  Dietary/ Lifestyle Progress: Weight continues to decrease steadily Patient voices some concern over hair loss Few cooked meals as patient does not like to cook, and is preparing meals for herself only. She is making healthy choices most of the time. She will plan to schedule a 26-month post-op visit when she knows her work schedule. Needs to return in late November or early December.  Dietary recall: Eating pattern: 3 meals and 1-2 snacks daily Dining out: 0-1 Breakfast: protein shake or eggs with January sausage Snack: usually none  Lunch: tuna from pouch; protein shake; high protein frozen meal Snack: slim jim dried meat stick, cheese; yogurt; cottage cheese  Dinner: frozen meal; grilled chicken from restaurant + frozen veg Snack: protein shake, sometimes something with carbs  Fluid intake: water with sugar free flavoring 64oz + daily, protein shakes 22 oz daily Estimated total protein intake: 60-70g daily Bariatric diet adherence:  Using straws: yes Drinking fluids during meals: few sips Carbonated beverages: no  Recent physical activity:  BELT program 3x  weekly   Nutrition Intervention:   Patient continues to follow bariatric diet closely and is motivated to continue. Discussed resources to help with meal planning balanced meals that don't require much cooking Reviewed vitamin supplementation needs and types of vitamins suitable for post- bariatric surgery status.  Nutritional Diagnosis: [place]-3.3 Overweight/obesity As related to history of excess calories and inadequate physical activity.  As evidenced by patient with current BMI of 34.2, following bariatric diet guidelines for ongoing weight loss after sleeve gastrectomy procedure.   Teaching Method Utilized:  [place] provided: ASMBS guidelines for vitamin and mineral supplementation Bariatric menus (AND)  Learning Readiness:  Change in progress  Barriers to learning/adherence to lifestyle change: none  Demonstrated degree of understanding via:  Teach Back      Plan: Follow goals as outlined in patient insructions Return for 42-month follow up between Nov 25 and Feb 21, 2022

## 2021-11-07 DIAGNOSIS — F4323 Adjustment disorder with mixed anxiety and depressed mood: Secondary | ICD-10-CM | POA: Diagnosis not present

## 2021-11-07 DIAGNOSIS — F331 Major depressive disorder, recurrent, moderate: Secondary | ICD-10-CM | POA: Diagnosis not present

## 2021-11-07 DIAGNOSIS — F411 Generalized anxiety disorder: Secondary | ICD-10-CM | POA: Diagnosis not present

## 2021-11-21 DIAGNOSIS — F331 Major depressive disorder, recurrent, moderate: Secondary | ICD-10-CM | POA: Diagnosis not present

## 2021-11-30 ENCOUNTER — Other Ambulatory Visit: Payer: Self-pay | Admitting: Nurse Practitioner

## 2021-11-30 DIAGNOSIS — Z3044 Encounter for surveillance of vaginal ring hormonal contraceptive device: Secondary | ICD-10-CM

## 2021-12-02 NOTE — Telephone Encounter (Signed)
Last AEX-09/17/2020--nothing schedule as of today.  Will send pt mychart msg.

## 2021-12-02 NOTE — Telephone Encounter (Signed)
Now scheduled for 12/05/21.

## 2021-12-05 ENCOUNTER — Other Ambulatory Visit (HOSPITAL_COMMUNITY)
Admission: RE | Admit: 2021-12-05 | Discharge: 2021-12-05 | Disposition: A | Discharge status: Home / Self Care | Payer: BC Managed Care – PPO | Source: Ambulatory Visit | Attending: Nurse Practitioner | Admitting: Nurse Practitioner

## 2021-12-05 ENCOUNTER — Encounter: Payer: Self-pay | Admitting: Nurse Practitioner

## 2021-12-05 ENCOUNTER — Ambulatory Visit (INDEPENDENT_AMBULATORY_CARE_PROVIDER_SITE_OTHER): Payer: BC Managed Care – PPO | Admitting: Nurse Practitioner

## 2021-12-05 VITALS — BP 120/80 | HR 76 | Resp 12 | Ht 62.75 in | Wt 196.0 lb

## 2021-12-05 DIAGNOSIS — Z01419 Encounter for gynecological examination (general) (routine) without abnormal findings: Secondary | ICD-10-CM | POA: Diagnosis not present

## 2021-12-05 DIAGNOSIS — Z113 Encounter for screening for infections with a predominantly sexual mode of transmission: Secondary | ICD-10-CM | POA: Diagnosis not present

## 2021-12-05 DIAGNOSIS — Z3044 Encounter for surveillance of vaginal ring hormonal contraceptive device: Secondary | ICD-10-CM

## 2021-12-05 MED ORDER — NUVARING 0.12-0.015 MG/24HR VA RING: VAGINAL_RING | VAGINAL | 3 refills | Status: DC

## 2021-12-05 NOTE — Progress Notes (Signed)
   Catherine Shah 04/02/1985 [id number]   History:  36 y.o. G0 presents for annual exam. No GYN complaints. Monthly cycles/Nuvaring. 10/2017 normal cytology positive HPV, 2020/2022 normal negative HPV. Has received Gardasil series. 04/2021 gastric sleeve procedure, down 60 pounds. History of vitamin D deficiency, hypertension, heart murmur.   Gynecologic History Patient's last menstrual period was 11/18/2021. Period Cycle (Days): 28 Period Duration (Days): 5 Period Pattern: Regular Menstrual Flow: Moderate Menstrual Control: Other (Comment), Maxi pad (cup) Dysmenorrhea: (!) Mild Dysmenorrhea Symptoms: Cramping Contraception/Family planning: NuvaRing vaginal inserts Sexually active: Yes  Health Maintenance Last Pap: 09/17/2020 Results were: Normal neg HPV, 3-year repeat Last mammogram: Not indicated  Last colonoscopy: Not indicated Last Dexa: Not indicated  Past medical history, past surgical history, family history and social history were all reviewed and documented in the EPIC chart. 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   ROS:  A ROS was performed and pertinent positives and negatives are included.  Exam:  Vitals:   12/05/21 1337  BP: 120/80  Pulse: 76  Resp: 12  Weight: 196 lb (88.9 kg)  Height: 5' 2.75" (1.594 m)    Body mass index is 35 kg/m.  General appearance:  Normal Thyroid:  Symmetrical, normal in size, without palpable masses or nodularity. Respiratory  Auscultation:  Clear without wheezing or rhonchi Cardiovascular  Auscultation:  Regular rate, without rubs, murmurs or gallops  Edema/varicosities:  Not grossly evident Abdominal  Soft,nontender, without masses, guarding or rebound.  Liver/spleen:  No organomegaly noted  Hernia:  None appreciated  Skin  Inspection:  Grossly normal Breasts: Examined lying and sitting.   Right: Without masses, retractions, nipple discharge or axillary adenopathy.   Left: Without masses, retractions, nipple  discharge or axillary adenopathy. Genitourinary   Inguinal/mons:  Normal without inguinal adenopathy  External genitalia:  Normal appearing vulva with no masses, tenderness, or lesions  BUS/Urethra/Skene's glands:  Normal  Vagina:  Normal appearing with normal color and discharge, no lesions.  Cervix:  Normal appearing without discharge or lesions  Uterus:  Difficult to palpate due to body habitus but no gross masses or tenderness  Adnexa/parametria:     Rt: Normal in size, without masses or tenderness.   Lt: Normal in size, without masses or tenderness.  Anus and perineum: Normal  Patient informed chaperone available to be present for breast and pelvic exam. Patient has requested no chaperone to be present. Patient has been advised what will be completed during breast and pelvic exam.   Assessment/Plan:  36 y.o. G0 for annual exam.   Well female exam with routine gynecological exam - Plan: Cytology - PAP( [name]). Education provided on SBEs, importance of preventative screenings, current guidelines, high calcium diet, regular exercise, and multivitamin daily. Labs with PCP.   Screen for STD (sexually transmitted disease) - Plan: Cytology - PAP( [name]), RPR, HIV Antibody (routine testing w rflx). GC/chlamydia/trich added to pap.   Encounter for surveillance of vaginal ring hormonal contraceptive device - Plan: etonogestrel-ethinyl estradiol (NUVARING) 0.12-0.015 MG/24HR vaginal ring. Using as prescribed. Refill x 1 year provided.   Screening for cervical cancer - 10/2017 normal cytology positive HPV, 2020/2022 normal negative HPV. Discussed recommendations to repeat at 3-year interval. She wants pap today.   Return in 1 year for annual.     [name] DNP, 1:55 PM 12/05/2021

## 2021-12-06 DIAGNOSIS — F331 Major depressive disorder, recurrent, moderate: Secondary | ICD-10-CM | POA: Diagnosis not present

## 2021-12-06 LAB — HIV ANTIBODY (ROUTINE TESTING W REFLEX): HIV 1&2 Ab, 4th Generation: NONREACTIVE

## 2021-12-06 LAB — RPR: RPR Ser Ql: NONREACTIVE

## 2021-12-10 ENCOUNTER — Ambulatory Visit: Payer: BC Managed Care – PPO | Admitting: Family Medicine

## 2021-12-10 LAB — CYTOLOGY - PAP
Chlamydia: NEGATIVE
Comment: NEGATIVE
Comment: NEGATIVE
Comment: NORMAL
Diagnosis: NEGATIVE
Neisseria Gonorrhea: NEGATIVE
Trichomonas: NEGATIVE

## 2021-12-12 DIAGNOSIS — F331 Major depressive disorder, recurrent, moderate: Secondary | ICD-10-CM | POA: Diagnosis not present

## 2021-12-26 DIAGNOSIS — F331 Major depressive disorder, recurrent, moderate: Secondary | ICD-10-CM | POA: Diagnosis not present

## 2022-03-06 ENCOUNTER — Encounter: Payer: Self-pay | Admitting: Nurse Practitioner

## 2022-03-06 DIAGNOSIS — Z3044 Encounter for surveillance of vaginal ring hormonal contraceptive device: Secondary | ICD-10-CM

## 2022-03-06 MED ORDER — ETONOGESTREL-ETHINYL ESTRADIOL 0.12-0.015 MG/24HR VA RING: 1.0000 | VAGINAL_RING | VAGINAL | 2 refills | Status: DC

## 2022-03-06 NOTE — Telephone Encounter (Signed)
FYI. Last AEX 12/05/2021--nothing scheduled yet for 2024.  Will approve for enough refills until next annual.

## 2022-05-22 ENCOUNTER — Ambulatory Visit: Payer: Self-pay | Admitting: Nurse Practitioner

## 2022-06-02 ENCOUNTER — Ambulatory Visit (HOSPITAL_COMMUNITY): Payer: BC Managed Care – PPO

## 2022-06-02 ENCOUNTER — Ambulatory Visit
Admission: RE | Admit: 2022-06-02 | Discharge: 2022-06-02 | Disposition: A | Discharge status: Home / Self Care | Payer: BC Managed Care – PPO | Source: Ambulatory Visit | Attending: Urgent Care | Admitting: Urgent Care

## 2022-06-02 VITALS — BP 106/70 | HR 83 | Temp 98.3°F | Resp 18 | Ht 64.0 in | Wt 185.0 lb

## 2022-06-02 DIAGNOSIS — J019 Acute sinusitis, unspecified: Secondary | ICD-10-CM

## 2022-06-02 DIAGNOSIS — R051 Acute cough: Secondary | ICD-10-CM | POA: Diagnosis not present

## 2022-06-02 DIAGNOSIS — B9689 Other specified bacterial agents as the cause of diseases classified elsewhere: Secondary | ICD-10-CM

## 2022-06-02 MED ORDER — AMOXICILLIN-POT CLAVULANATE 875-125 MG PO TABS: 1.0000 | ORAL_TABLET | Freq: Two times a day (BID) | ORAL | 0 refills | Status: AC

## 2022-06-02 MED ORDER — BENZONATATE 100 MG PO CAPS: ORAL_CAPSULE | ORAL | 0 refills | Status: DC

## 2022-06-02 MED ORDER — HYDROCOD POLI-CHLORPHE POLI ER 10-8 MG/5ML PO SUER: 5.0000 mL | Freq: Two times a day (BID) | ORAL | 0 refills | Status: AC | PRN

## 2022-06-02 NOTE — Discharge Instructions (Signed)
Follow up here or with your primary care provider if your symptoms are worsening or not improving with treatment.     

## 2022-06-02 NOTE — ED Triage Notes (Signed)
Patient to Urgent Care with complaints of nasal congestion and a dry cough. Sinus pain and pressure. Poor sleep. Denies any known fevers.  Symptoms started 2.5 weeks ago.   Had been taking advil cold and sinus/ delsym.

## 2022-06-02 NOTE — ED Provider Notes (Signed)
Catherine Shah    CSN: [id number] Arrival date & time: 06/02/22  1644      History   Chief Complaint Chief Complaint  Patient presents with   Cough    Entered by patient    HPI Catherine Shah is a 37 y.o. female.    Cough   Presents with complaint of nasal congestion and dry cough, denies sinus pain or pressure x 2 half weeks.  Denies any known fevers.  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   Morbid (severe) obesity due to excess calories ([place]) 05/06/2021   Nasal congestion 10/19/2020   Neck pain 01/25/2020   Concussion 01/25/2020   Vitamin D deficiency 10/28/2019   Chronic pain of both knees 11/06/2018   Morbid obesity with body mass index (BMI) of 40.0 to 44.9 in adult [facility]) 11/06/2018   Thrombosed external hemorrhoids 01/05/2018   Palpitations 07/12/2015   Dizziness 07/12/2015   Anxiety 07/12/2015   History of hypertension XX123456   Systolic murmur [id number]   Epilepsy ([place]) 02/27/2015   Insomnia 02/27/2015   Acid reflux 02/27/2015    Past Surgical History:  Procedure Laterality Date   LAPAROSCOPIC GASTRIC SLEEVE RESECTION N/A 05/06/2021   Procedure: LAPAROSCOPIC GASTRIC SLEEVE RESECTION;  Surgeon: Mickeal Skinner, MD;  Location: [facility];  Service: General;  Laterality: N/A;   TONSILLECTOMY AND ADENOIDECTOMY  1994   TOOTH EXTRACTION  age 72   UPPER GI ENDOSCOPY N/A 05/06/2021   Procedure: UPPER GI ENDOSCOPY;  Surgeon: Mickeal Skinner, MD;  Location: [facility];  Service: General;  Laterality: N/A;    OB History     Gravida  0    Para  0   Term  0   Preterm  0   AB  0   Living  0      SAB  0   IAB  0   Ectopic  0   Multiple  0   Live Births  0            Home Medications    Prior to Admission medications   Medication Sig Start Date End Date Taking? Authorizing Provider  buPROPion (WELLBUTRIN SR) 150 MG 12 hr tablet Take 300 mg by mouth every morning.    [provider]  Calcium Carb-Cholecalciferol (CALCIUM 500/VITAMIN D PO) Take 1 tablet by mouth in the morning, at noon, and at bedtime.    [provider]  citalopram (CELEXA) 40 MG tablet Take 40 mg by mouth daily.    [provider]  etonogestrel-ethinyl estradiol (NUVARING) 0.12-0.015 MG/24HR vaginal ring Place 1 each vaginally every 28 (twenty-eight) days. Insert vaginally and leave in place for 3 consecutive weeks, then remove for 1 week. 03/06/22   [name], NP  gabapentin (NEURONTIN) 100 MG capsule Take 100 mg by mouth 3 (three) times daily as needed (pain).    [provider]  gabapentin (NEURONTIN) 800 MG tablet Take 800 mg by mouth at bedtime.     [provider]  Multiple Vitamin (MULTI-VITAMIN) tablet Take 1 tablet by mouth daily.    [provider]  Multiple Vitamins-Minerals (BARIATRIC MULTIVITAMINS/IRON PO) Take 1 tablet by mouth in the morning and at bedtime.    [provider]    Family History Family History  Problem Relation Age of Onset   Hypertension Mother    Depression Mother    Diabetes Mother    Stroke Mother    Cancer Father        Bile duct cancer   Hypertension Maternal Grandmother    Depression Maternal Grandmother    Hypertension Maternal Grandfather    Coronary artery disease Other        [place]   Cancer Other        [facility]   Stroke Other        [facility]   Lung disease Other        [facility]    Social History Social History   Tobacco Use   Smoking status: Never   Smokeless tobacco: Never  Vaping Use   Vaping Use: Never used  Substance  Use Topics   Alcohol use: Not Currently    Comment: rarely   Drug use: No     Allergies   Tape   Review of Systems Review of Systems  Respiratory:  Positive for cough.      Physical Exam Triage Vital Signs ED Triage Vitals  Enc Vitals Group     BP 06/02/22 1651 106/70     Pulse Rate 06/02/22 1651 83     Resp 06/02/22 1651 18     Temp 06/02/22 1651 98.3 F (36.8 C)     Temp src --      SpO2 06/02/22 1651 98 %     Weight 06/02/22 1650 185 lb (83.9 kg)     Height 06/02/22 1650 5\' 4"  (1.626 m)     Head Circumference --      Peak Flow --      Pain Score 06/02/22 1649 6     Pain Loc --      Pain Edu? --      Excl. in [place]? --    No data found.  Updated Vital Signs BP 106/70   Pulse 83   Temp 98.3 F (36.8 C)   Resp 18   Ht 5\' 4"  (1.626 m)   Wt 185 lb (83.9 kg)   LMP 05/26/2022   SpO2 98%   BMI 31.76 kg/m   Visual Acuity Right Eye Distance:   Left Eye Distance:   Bilateral Distance:    Right Eye Near:   Left Eye Near:    Bilateral Near:     Physical Exam Vitals reviewed.  Constitutional:      Appearance: Normal appearance. She is ill-appearing.  HENT:     Nose:     Right Sinus: Maxillary sinus tenderness present.     Left Sinus: Maxillary sinus tenderness present.     Mouth/Throat:     Pharynx: Oropharynx is clear. No oropharyngeal exudate or posterior oropharyngeal erythema.     Tonsils: No tonsillar exudate.  Cardiovascular:     Rate and Rhythm: Normal rate and regular rhythm.     Pulses: Normal pulses.     Heart sounds: Normal heart sounds.  Pulmonary:     Effort: Pulmonary effort is normal.     Breath sounds: Normal breath sounds.  Skin:    General: Skin is warm and dry.  Neurological:  General: No focal deficit present.     Mental Status: She is alert and oriented to person, place, and time.  Psychiatric:        Mood and Affect: Mood normal.        Behavior: Behavior normal.      UC Treatments / Results  Labs (all labs  ordered are listed, but only abnormal results are displayed) Labs Reviewed - No data to display  EKG   Radiology No results found.  Procedures Procedures (including critical care time)  Medications Ordered in UC Medications - No data to display  Initial Impression / Assessment and Plan / UC Course  I have reviewed the triage vital signs and the nursing notes.  Pertinent labs & imaging results that were available during my care of the patient were reviewed by me and considered in my medical decision making (see chart for details).   Patient is afebrile here without recent antipyretics. Satting well on room air. Overall is ill appearing, well hydrated, without respiratory distress. Pulmonary exam is unremarkable.  Lungs CTAB without wheezing, rhonchi, rales.  TMs are WNL bilaterally.  There is erythema in the EAC bilaterally.  Bilateral maxillary sinus tenderness is present.  Given duration of symptoms, will treat for acute bacterial sinusitis.  She also states that OTC medications are not controlling her cough.  Will prescribe benzonatate for daytime use and Tussionex for nighttime use.  Reviewed treatment plan with patient who acknowledges understanding and agreement.  Final Clinical Impressions(s) / UC Diagnoses   Final diagnoses:  None   Discharge Instructions   None    ED Prescriptions   None    PDMP not reviewed this encounter.   Rose Phi, [place] 06/02/22 [phone]

## 2022-06-26 ENCOUNTER — Ambulatory Visit (INDEPENDENT_AMBULATORY_CARE_PROVIDER_SITE_OTHER): Payer: 59 | Admitting: Nurse Practitioner

## 2022-06-26 ENCOUNTER — Encounter: Payer: Self-pay | Admitting: Nurse Practitioner

## 2022-06-26 VITALS — BP 98/62 | HR 81

## 2022-06-26 DIAGNOSIS — R300 Dysuria: Secondary | ICD-10-CM | POA: Diagnosis not present

## 2022-06-26 DIAGNOSIS — N760 Acute vaginitis: Secondary | ICD-10-CM

## 2022-06-26 DIAGNOSIS — B9689 Other specified bacterial agents as the cause of diseases classified elsewhere: Secondary | ICD-10-CM

## 2022-06-26 DIAGNOSIS — N94818 Other vulvodynia: Secondary | ICD-10-CM

## 2022-06-26 DIAGNOSIS — Z113 Encounter for screening for infections with a predominantly sexual mode of transmission: Secondary | ICD-10-CM | POA: Diagnosis not present

## 2022-06-26 DIAGNOSIS — K644 Residual hemorrhoidal skin tags: Secondary | ICD-10-CM

## 2022-06-26 LAB — WET PREP FOR TRICH, YEAST, CLUE

## 2022-06-26 MED ORDER — METRONIDAZOLE 500 MG PO TABS: 500.0000 mg | ORAL_TABLET | Freq: Two times a day (BID) | ORAL | 0 refills | Status: DC

## 2022-06-26 NOTE — Progress Notes (Signed)
   Acute Office Visit  Subjective:    Patient ID: Catherine Shah, female    DOB: [birth year], 37 y.o.   MRN: [id number]   HPI 37 y.o. presents today for dysuria and vulvar swelling. Denies itching, discharge or odor. Recently on antibiotics for URI. External hemorrhoid is bothering her. Thinks flights to [place] triggered them. No bleeding, just pain. H/O hemorrhoidectomy.   Patient's last menstrual period was 06/13/2022 (exact date).    Review of Systems  Constitutional: Negative.   Gastrointestinal:  Positive for rectal pain (External hemorrhoid).  Genitourinary:  Positive for vaginal pain (Vulvar irritation). Negative for genital sores and vaginal discharge.       Objective:    Physical Exam Constitutional:      Appearance: Normal appearance.  Genitourinary:    Vagina: Vaginal discharge present. No erythema.     Cervix: Normal.     Comments: Mild external redness    BP 98/62   Pulse 81   LMP 06/13/2022 (Exact Date)   SpO2 98%  Wt Readings from Last 3 Encounters:  06/02/22 185 lb (83.9 kg)  12/05/21 196 lb (88.9 kg)  11/06/21 199 lb 8 oz (90.5 kg)        Patient informed chaperone available to be present for breast and/or pelvic exam. Patient has requested no chaperone to be present. Patient has been advised what will be completed during breast and pelvic exam.   Wet prep + clue cells (+ odor) UA negative leukocytes, negative nitrites, 2+ blood, dark yellow/cloudy. Microscopic: wbc none, rbc 3-10, few bacteria  Assessment & Plan:   Problem List Items Addressed This Visit       Other   Screening examination for STD (sexually transmitted disease)   Relevant Orders   C. trachomatis/N. gonorrhoeae RNA   RPR   HIV Antibody (routine testing w rflx)   Other Visit Diagnoses     Bacterial vaginosis    -  Primary   Relevant Medications   metroNIDAZOLE (FLAGYL) 500 MG tablet   Dysuria       Relevant Orders   Urinalysis,Complete w/RFL Culture   Vulvar discomfort        Relevant Orders   C. trachomatis/N. gonorrhoeae RNA   WET PREP FOR TRICH, YEAST, CLUE   External hemorrhoid          Plan: Wet prep positive for clue cells - Flagyl 500 mg BID x 7 days. STD panel pending. UA unremarkable. External hemorrhoid non-erythematous, non-bleeding. Recommend preparation-H BID, gentle wiping, and avoiding constipation.      Olivia Mackie DNP, 8:49 AM 06/26/2022

## 2022-06-27 LAB — C. TRACHOMATIS/N. GONORRHOEAE RNA
C. trachomatis RNA, TMA: NOT DETECTED
N. gonorrhoeae RNA, TMA: NOT DETECTED

## 2022-06-27 LAB — HIV ANTIBODY (ROUTINE TESTING W REFLEX): HIV 1&2 Ab, 4th Generation: NONREACTIVE

## 2022-06-27 LAB — RPR: RPR Ser Ql: NONREACTIVE

## 2022-06-28 ENCOUNTER — Telehealth: Payer: BC Managed Care – PPO | Admitting: Nurse Practitioner

## 2022-06-28 DIAGNOSIS — L209 Atopic dermatitis, unspecified: Secondary | ICD-10-CM

## 2022-06-28 LAB — URINALYSIS, COMPLETE W/RFL CULTURE
Bilirubin Urine: NEGATIVE
Casts: NONE SEEN /LPF
Crystals: NONE SEEN /HPF
Glucose, UA: NEGATIVE
Hyaline Cast: NONE SEEN /LPF
Leukocyte Esterase: NEGATIVE
Nitrites, Initial: NEGATIVE
Protein, ur: NEGATIVE
Specific Gravity, Urine: 1.025 (ref 1.001–1.035)
WBC, UA: NONE SEEN /HPF (ref 0–5)
Yeast: NONE SEEN /HPF
pH: 6 (ref 5.0–8.0)

## 2022-06-28 LAB — URINE CULTURE
MICRO NUMBER:: 14842747
Result:: NO GROWTH
SPECIMEN QUALITY:: ADEQUATE

## 2022-06-28 LAB — CULTURE INDICATED

## 2022-06-28 MED ORDER — TRIAMCINOLONE ACETONIDE 0.025 % EX OINT: 1.0000 | TOPICAL_OINTMENT | Freq: Two times a day (BID) | CUTANEOUS | 0 refills | Status: AC

## 2022-06-28 NOTE — Progress Notes (Signed)
I have spent 5 minutes in review of e-visit questionnaire, review and updating patient chart, medical decision making and response to patient.  ° °Catherine Shah Catherine Kielyn Kardell, NP ° °  °

## 2022-06-28 NOTE — Progress Notes (Signed)

## 2022-07-17 ENCOUNTER — Encounter: Payer: Self-pay | Admitting: Nurse Practitioner

## 2022-07-17 ENCOUNTER — Telehealth: Payer: BC Managed Care – PPO | Admitting: Nurse Practitioner

## 2022-07-17 DIAGNOSIS — N898 Other specified noninflammatory disorders of vagina: Secondary | ICD-10-CM

## 2022-07-17 NOTE — Progress Notes (Signed)
Because your symptoms have returned, I feel your condition warrants further evaluation and I recommend that you be seen for a face to face visit.  Please contact your primary care physician practice to be seen. Many offices offer virtual options to be seen via video if you are not comfortable going in person to a medical facility at this time.  We recommend you contact your primary care or OBGYN for testing   NOTE: You will NOT be charged for this eVisit.  If you do not have a PCP, [facility] offers a free physician referral service available at [phone]. Our trained staff has the experience, knowledge and resources to put you in touch with a physician who is right for you.    If you are having a true medical emergency please call 911.   Your e-visit answers were reviewed by a board certified advanced clinical practitioner to complete your personal care plan.  Thank you for using e-Visits.

## 2022-07-18 ENCOUNTER — Ambulatory Visit
Admission: RE | Admit: 2022-07-18 | Discharge: 2022-07-18 | Disposition: A | Discharge status: Home / Self Care | Payer: 59 | Source: Ambulatory Visit | Attending: Urgent Care | Admitting: Urgent Care

## 2022-07-18 VITALS — BP 122/85 | HR 85 | Temp 99.4°F | Resp 18

## 2022-07-18 DIAGNOSIS — N898 Other specified noninflammatory disorders of vagina: Secondary | ICD-10-CM | POA: Insufficient documentation

## 2022-07-18 LAB — POCT URINALYSIS DIP (MANUAL ENTRY)
Glucose, UA: NEGATIVE mg/dL
Ketones, POC UA: NEGATIVE mg/dL
Nitrite, UA: NEGATIVE
Protein Ur, POC: 30 mg/dL — AB
Spec Grav, UA: >=1.03 — AB (ref 1.010–1.025)
Urobilinogen, UA: 4 E.U./dL — AB
pH, UA: 6.5 (ref 5.0–8.0)

## 2022-07-18 LAB — POCT URINE PREGNANCY: Preg Test, Ur: NEGATIVE

## 2022-07-18 NOTE — ED Triage Notes (Addendum)
Patient to Urgent Care with complaints of vaginal discharge. Symptoms started Sunday. Denies any urinary symptoms.   Reports history of BV. Patient was treated with flagyl 2-3 weeks ago. Currently using boric acid suppositories.

## 2022-07-18 NOTE — ED Provider Notes (Signed)
Renaldo Fiddler    CSN: [id number] Arrival date & time: 07/18/22  1006      History   Chief Complaint Chief Complaint  Patient presents with   Vaginal Discharge    Soreness, swelling, irritation most likely BV - Entered by patient    HPI Ilyana Elward is a 37 y.o. female.    Vaginal Discharge   Presents to urgent care with complaint of vaginal discharge x 6 days.  Denies urinary symptoms and reports history of BV.  She states she was treated with Flagyl 2 to 3 weeks ago and using "boric acid" vaginal suppositories.  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   Morbid (severe) obesity due to excess calories ([facility]) 05/06/2021   Nasal congestion 10/19/2020   Neck pain 01/25/2020   Concussion 01/25/2020   Vitamin D deficiency 10/28/2019   Chronic pain of both knees 11/06/2018   Morbid obesity with body mass index (BMI) of 40.0 to 44.9 in adult [facility]) 11/06/2018   Thrombosed external hemorrhoids 01/05/2018   Palpitations 07/12/2015   Dizziness 07/12/2015   Anxiety 07/12/2015   History of hypertension 07/12/2015   Systolic murmur 07/12/2015   Epilepsy ([facility]) 02/27/2015   Insomnia 02/27/2015   Acid reflux 02/27/2015    Past Surgical History:  Procedure Laterality Date   LAPAROSCOPIC GASTRIC SLEEVE RESECTION N/A 05/06/2021   Procedure: LAPAROSCOPIC GASTRIC SLEEVE RESECTION;  Surgeon: Rodman Pickle, MD;  [facility];  Service: General;  Laterality: N/A;   TONSILLECTOMY AND ADENOIDECTOMY  1994   TOOTH EXTRACTION  age 42    UPPER GI ENDOSCOPY N/A 05/06/2021   Procedure: UPPER GI ENDOSCOPY;  Surgeon: Rodman Pickle, MD;  Location: [facility];  Service: General;  Laterality: N/A;    OB History     Gravida  0   Para  0   Term  0   Preterm  0   AB  0   Living  0      SAB  0   IAB  0   Ectopic  0   Multiple  0   Live Births  0            Home Medications    Prior to Admission medications   Medication Sig Start Date End Date Taking? Authorizing Provider  buPROPion (WELLBUTRIN SR) 150 MG 12 hr tablet Take 300 mg by mouth every morning.    [provider]  Calcium Carb-Cholecalciferol (CALCIUM 500/VITAMIN D PO) Take 1 tablet by mouth in the morning, at noon, and at bedtime.    [provider]  citalopram (CELEXA) 40 MG tablet Take 40 mg by mouth daily.    [provider]  etonogestrel-ethinyl estradiol (NUVARING) 0.12-0.015 MG/24HR vaginal ring Place 1 each vaginally every 28 (twenty-eight) days. Insert vaginally and leave in place for 3 consecutive weeks, then remove for 1 week. 03/06/22   Wyline Beady A, NP  gabapentin (NEURONTIN) 100 MG capsule Take 100 mg by mouth  3 (three) times daily as needed (pain).    [provider]  gabapentin (NEURONTIN) 800 MG tablet Take 800 mg by mouth at bedtime.     [provider]  meloxicam (MOBIC) 7.5 MG tablet Take by mouth. Patient not taking: Reported on 07/18/2022 11/03/18   [provider]  metroNIDAZOLE (FLAGYL) 500 MG tablet Take 1 tablet (500 mg total) by mouth 2 (two) times daily. Patient not taking: Reported on 07/18/2022 06/26/22   Wyline Beady A, NP  Multiple Vitamin (MULTI-VITAMIN) tablet Take 1 tablet by mouth daily.    [provider]  Multiple Vitamins-Minerals (BARIATRIC MULTIVITAMINS/IRON PO) Take 1 tablet by mouth in the morning and at bedtime.    [provider]  triamcinolone (KENALOG) 0.025 % ointment Apply 1 Application topically 2 (two) times daily.  06/28/22   Claiborne Rigg, NP    Family History Family History  Problem Relation Age of Onset   Hypertension Mother    Depression Mother    Diabetes Mother    Stroke Mother    Cancer Father        Bile duct cancer   Hypertension Maternal Grandmother    Depression Maternal Grandmother    Hypertension Maternal Grandfather    Coronary artery disease Other        [facility]   Cancer Other        [facility]   Stroke Other        [facility]   Lung disease Other        [facility]    Social History Social History   Tobacco Use   Smoking status: Never   Smokeless tobacco: Never  Vaping Use   Vaping Use: Never used  Substance Use Topics   Alcohol use: Not Currently    Comment: rarely   Drug use: No     Allergies   Tape   Review of Systems Review of Systems  Genitourinary:  Positive for vaginal discharge.     Physical Exam Triage Vital Signs ED Triage Vitals [07/18/22 1027]  Enc Vitals Group     BP 122/85     Pulse Rate 85     Resp 18     Temp 99.4 F (37.4 C)     Temp Source Oral     SpO2 97 %     Weight      Height      Head Circumference      Peak Flow      Pain Score 6     Pain Loc      Pain Edu?      Excl. in [place]?    No data found.  Updated Vital Signs BP 122/85 (BP Location: Left Arm)   Pulse 85   Temp 99.4 F (37.4 C) (Oral)   Resp 18   LMP 06/25/2022 (Exact Date)   SpO2 97%   Visual Acuity Right Eye Distance:   Left Eye Distance:   Bilateral Distance:    Right Eye Near:   Left Eye Near:    Bilateral Near:     Physical Exam Vitals reviewed.  Constitutional:      Appearance: Normal appearance.  Skin:    General: Skin is warm and dry.  Neurological:     General: No focal deficit present.     Mental Status: She is alert and oriented to person, place, and time.  Psychiatric:        Mood and Affect: Mood normal.  Behavior: Behavior normal.      UC Treatments / Results  Labs (all labs ordered are listed, but only abnormal results are  displayed) Labs Reviewed  POCT URINE PREGNANCY  POCT URINALYSIS DIP (MANUAL ENTRY)  CERVICOVAGINAL ANCILLARY ONLY    EKG   Radiology No results found.  Procedures Procedures (including critical care time)  Medications Ordered in UC Medications - No data to display  Initial Impression / Assessment and Plan / UC Course  I have reviewed the triage vital signs and the nursing notes.  Pertinent labs & imaging results that were available during my care of the patient were reviewed by me and considered in my medical decision making (see chart for details).   Vassiliki Defibaugh is a 37 y.o. female presenting with vag discharge. Patient is afebrile without recent antipyretics, satting well on room air. Overall is well appearing though non-toxic, well hydrated, without respiratory distress.  Vaginal swab was obtained and results pending.  Final Clinical Impressions(s) / UC Diagnoses   Final diagnoses:  Vaginal discharge   Discharge Instructions   None    ED Prescriptions   None    PDMP not reviewed this encounter.   Charma Igo, [place] 07/18/22 1044

## 2022-07-18 NOTE — Discharge Instructions (Signed)
Follow up here or with your primary care provider if your symptoms are worsening or not improving.     

## 2022-07-21 ENCOUNTER — Telehealth: Payer: Self-pay

## 2022-07-21 LAB — CERVICOVAGINAL ANCILLARY ONLY
Bacterial Vaginitis (gardnerella): NEGATIVE
Candida Glabrata: NEGATIVE
Candida Vaginitis: POSITIVE — AB
Chlamydia: NEGATIVE
Comment: NEGATIVE
Comment: NEGATIVE
Comment: NEGATIVE
Comment: NEGATIVE
Comment: NEGATIVE
Comment: NORMAL
Neisseria Gonorrhea: NEGATIVE
Trichomonas: NEGATIVE

## 2022-07-21 MED ORDER — FLUCONAZOLE 150 MG PO TABS: 150.0000 mg | ORAL_TABLET | Freq: Every day | ORAL | 0 refills | Status: DC

## 2022-07-21 NOTE — Progress Notes (Signed)
TC to pt, ID verified. Advised of positive for Candida Vaginitis and per protocol Rx Diflucan sent to pharmacy on file, reviewed instructions, agrees. No add'l concerns at this time.

## 2022-08-11 ENCOUNTER — Encounter: Payer: Self-pay | Admitting: Nurse Practitioner

## 2022-08-11 DIAGNOSIS — Z3044 Encounter for surveillance of vaginal ring hormonal contraceptive device: Secondary | ICD-10-CM

## 2022-08-12 MED ORDER — ETONOGESTREL-ETHINYL ESTRADIOL 0.12-0.015 MG/24HR VA RING: 1.0000 | VAGINAL_RING | VAGINAL | 0 refills | Status: DC

## 2022-08-12 NOTE — Telephone Encounter (Signed)
Pt calling to report needing generic refill on nuvaring sent to [facility] pharmacy in her chart since recent change of insurance.   Last AEX 12/05/2021--recall placed for 2024, nothing scheduled yet. Advised pt will receive reminder in the mail when time to call and schedule. She voiced understanding.

## 2022-08-18 ENCOUNTER — Ambulatory Visit (INDEPENDENT_AMBULATORY_CARE_PROVIDER_SITE_OTHER): Payer: 59 | Admitting: Nurse Practitioner

## 2022-08-18 VITALS — BP 122/70 | HR 78 | Wt 185.0 lb

## 2022-08-18 DIAGNOSIS — Z113 Encounter for screening for infections with a predominantly sexual mode of transmission: Secondary | ICD-10-CM | POA: Diagnosis not present

## 2022-08-18 DIAGNOSIS — N760 Acute vaginitis: Secondary | ICD-10-CM

## 2022-08-18 DIAGNOSIS — N898 Other specified noninflammatory disorders of vagina: Secondary | ICD-10-CM

## 2022-08-18 DIAGNOSIS — B9689 Other specified bacterial agents as the cause of diseases classified elsewhere: Secondary | ICD-10-CM

## 2022-08-18 LAB — WET PREP FOR TRICH, YEAST, CLUE

## 2022-08-18 MED ORDER — FLUCONAZOLE 150 MG PO TABS
150.0000 mg | ORAL_TABLET | ORAL | 0 refills | Status: DC
Start: 2022-08-18 — End: 2022-12-18

## 2022-08-18 MED ORDER — METRONIDAZOLE 500 MG PO TABS
500.0000 mg | ORAL_TABLET | Freq: Two times a day (BID) | ORAL | 0 refills | Status: DC
Start: 2022-08-18 — End: 2022-12-18

## 2022-08-18 NOTE — Progress Notes (Signed)
   Acute Office Visit  Subjective:    Patient ID: Naly Schwanz, female    DOB: 1985/11/25, 37 y.o.   MRN: [id number]   HPI 37 y.o. G0 presents today for vaginal discharge and irritation. Treated for BV 06/26/22, yeast 07/18/22. Took a leftover Diflucan 1 week ago. Did not notice much difference in symptoms. Would like STD screening today.   Patient's last menstrual period was 08/15/2022.    Review of Systems  Constitutional: Negative.   Genitourinary:  Positive for vaginal discharge and vaginal pain.       Objective:    Physical Exam Constitutional:      Appearance: Normal appearance.  Genitourinary:    Vagina: Bleeding (On menses) present. No vaginal discharge.     Cervix: Normal.     Comments: Generalized redness of labia    BP 122/70   Pulse 78   Wt 185 lb (83.9 kg)   LMP 08/15/2022   SpO2 100%   BMI 31.76 kg/m  Wt Readings from Last 3 Encounters:  08/18/22 185 lb (83.9 kg)  06/02/22 185 lb (83.9 kg)  12/05/21 196 lb (88.9 kg)        Patient informed chaperone available to be present for breast and/or pelvic exam. Patient has requested no chaperone to be present. Patient has been advised what will be completed during breast and pelvic exam.   Wet prep + clue cells (+ odor)   Assessment & Plan:   Problem List Items Addressed This Visit       Other   Screening examination for STD (sexually transmitted disease)   Relevant Orders   C. trachomatis/N. gonorrhoeae RNA   Vaginal discharge   Relevant Orders   WET PREP FOR TRICH, YEAST, CLUE (Completed)   Other Visit Diagnoses     Bacterial vaginosis    -  Primary   Relevant Medications   metroNIDAZOLE (FLAGYL) 500 MG tablet   fluconazole (DIFLUCAN) 150 MG tablet      Plan: Wet prep positive for clue cells - Flagyl 500 mg BID x 7 days. Diflucan provided as needed. GC/CT pending. Recommend boric acid suppositories twice weekly after treatment.      Olivia Mackie DNP, 9:16 AM 08/18/2022

## 2022-08-19 LAB — C. TRACHOMATIS/N. GONORRHOEAE RNA
C. trachomatis RNA, TMA: NOT DETECTED
N. gonorrhoeae RNA, TMA: NOT DETECTED

## 2022-10-14 ENCOUNTER — Ambulatory Visit: Payer: 59 | Admitting: Nurse Practitioner

## 2022-11-02 ENCOUNTER — Other Ambulatory Visit: Payer: Self-pay | Admitting: Nurse Practitioner

## 2022-11-02 DIAGNOSIS — Z3044 Encounter for surveillance of vaginal ring hormonal contraceptive device: Secondary | ICD-10-CM

## 2022-11-03 NOTE — Telephone Encounter (Signed)
Med refill request: Nuvaring  Last AEX: 12/05/21 -TW Next AEX: Not scheduled. Last MMG (if hormonal med) N/A.   Call placed to patient, left detailed message, ok per dpr. Advised refill request received for Nuvaring, will send request for 30 day supply to provider. Will need updated AEX for future refills. Return call to [facility] at [phone], option 1 to schedule appt.   Rx pended #1/[id number]  Refill authorized: Please Advise?

## 2022-12-09 ENCOUNTER — Ambulatory Visit: Payer: 59 | Admitting: Nurse Practitioner

## 2022-12-18 ENCOUNTER — Encounter: Payer: Self-pay | Admitting: Obstetrics and Gynecology

## 2022-12-18 ENCOUNTER — Ambulatory Visit (INDEPENDENT_AMBULATORY_CARE_PROVIDER_SITE_OTHER): Payer: 59 | Admitting: Obstetrics and Gynecology

## 2022-12-18 VITALS — BP 108/62 | HR 75 | Ht 64.0 in | Wt 183.0 lb

## 2022-12-18 DIAGNOSIS — Z3044 Encounter for surveillance of vaginal ring hormonal contraceptive device: Secondary | ICD-10-CM

## 2022-12-18 DIAGNOSIS — Z113 Encounter for screening for infections with a predominantly sexual mode of transmission: Secondary | ICD-10-CM | POA: Diagnosis not present

## 2022-12-18 DIAGNOSIS — Z01419 Encounter for gynecological examination (general) (routine) without abnormal findings: Secondary | ICD-10-CM | POA: Diagnosis not present

## 2022-12-18 MED ORDER — ETONOGESTREL-ETHINYL ESTRADIOL 0.12-0.015 MG/24HR VA RING: VAGINAL_RING | VAGINAL | 3 refills | Status: DC

## 2022-12-18 NOTE — Assessment & Plan Note (Signed)
Cervical cancer screening performed according to ASCCP guidelines. Labs and immunizations with her primary Encouraged safe sexual practices as indicated. Encouraged healthy lifestyle practices with diet and exercise

## 2022-12-18 NOTE — Progress Notes (Signed)
37 y.o. G0P0000 female s/p sleeve gastrectomy with epilepsy and mood disorder here for annual exam.   Patient's last menstrual period was 12/07/2022. Period Cycle (Days): 28 Period Duration (Days): 5-6 Period Pattern: Regular Menstrual Flow: Moderate Menstrual Control: Other (Comment) (disk) Dysmenorrhea: (!) Moderate Dysmenorrhea Symptoms: Cramping, Diarrhea  Abnormal bleeding: none Pelvic discharge or pain: none Breast mass, nipple discharge or skin changes : none Birth control: nuva ring Last PAP:     Component Value Date/Time   DIAGPAP  12/05/2021 1354    - Negative for intraepithelial lesion or malignancy (NILM)   DIAGPAP  09/17/2020 0927    - Negative for intraepithelial lesion or malignancy (NILM)   HPVHIGH Negative 09/17/2020 0927   ADEQPAP  12/05/2021 1354    Satisfactory for evaluation; transformation zone component PRESENT.   ADEQPAP  09/17/2020 0927    Satisfactory for evaluation; transformation zone component ABSENT.   Gardasil: yes Sexually active: yes  Exercising: working out at [employer]  GYN HISTORY: No significant hx  OB History  Gravida Para Term Preterm AB Living  0 0 0 0 0 0  SAB IAB Ectopic Multiple Live Births  0 0 0 0 0    Past Medical History:  Diagnosis Date   Arrhythmia    Generalized anxiety disorder    Heart murmur    HPV (human papilloma virus) infection    Major depression    Panic attacks    Seizure (HCC)    last in 2013   STD (sexually transmitted disease)    hx of HPV and Chlamydia    Past Surgical History:  Procedure Laterality Date   LAPAROSCOPIC GASTRIC SLEEVE RESECTION N/A 05/06/2021   Procedure: LAPAROSCOPIC GASTRIC SLEEVE RESECTION;  Surgeon: Sheliah Hatch, De Blanch, MD;  Location: [facility];  Service: General;  Laterality: N/A;   TONSILLECTOMY AND ADENOIDECTOMY  1994   TOOTH EXTRACTION  age 35   UPPER GI ENDOSCOPY N/A 05/06/2021   Procedure: UPPER GI ENDOSCOPY;  Surgeon: Rodman Pickle, MD;  Location: [facility];   Service: General;  Laterality: N/A;    Current Outpatient Medications on File Prior to Visit  Medication Sig Dispense Refill   buPROPion (WELLBUTRIN SR) 150 MG 12 hr tablet Take 300 mg by mouth every morning.     Calcium Carb-Cholecalciferol (CALCIUM 500/VITAMIN D PO) Take 1 tablet by mouth in the morning, at noon, and at bedtime.     citalopram (CELEXA) 40 MG tablet Take 40 mg by mouth daily.     gabapentin (NEURONTIN) 100 MG capsule Take 100 mg by mouth 3 (three) times daily as needed (pain).     gabapentin (NEURONTIN) 800 MG tablet Take 800 mg by mouth at bedtime.      methylphenidate (RITALIN) 10 MG tablet Take 10 mg by mouth 3 (three) times daily.     Multiple Vitamin (MULTI-VITAMIN) tablet Take 1 tablet by mouth daily.     Multiple Vitamins-Minerals (BARIATRIC MULTIVITAMINS/IRON PO) Take 1 tablet by mouth in the morning and at bedtime.     triamcinolone (KENALOG) 0.025 % ointment Apply 1 Application topically 2 (two) times daily. 30 g 0   No current facility-administered medications on file prior to visit.    Social History   Socioeconomic History   Marital status: Single    Spouse name: Not on file   Number of children: 0   Years of education: Not on file   Highest education level: Not on file  Occupational History   Occupation: Child psychotherapist  Tobacco Use   Smoking status: Never   Smokeless tobacco: Never  Vaping Use   Vaping status: Never Used  Substance and Sexual Activity   Alcohol use: Not Currently    Comment: rarely   Drug use: No   Sexual activity: Yes    Birth control/protection: Inserts    Comment: Nuvaring  Other Topics Concern   Not on file  Social History Narrative   Not on file   Social Determinants of Health   Financial Resource Strain: Not on file  Food Insecurity: Not on file  Transportation Needs: Not on file  Physical Activity: Not on file  Stress: Not on file  Social Connections: Not on file  Intimate Partner Violence: Not on file     Family History  Problem Relation Age of Onset   Hypertension Mother    Depression Mother    Diabetes Mother    Stroke Mother    Cancer Father        Bile duct cancer   Hypertension Maternal Grandmother    Depression Maternal Grandmother    Hypertension Maternal Grandfather    Coronary artery disease Other        [facility]   Cancer Other        [facility]   Stroke Other        [facility]   Lung disease Other        [facility]   Breast cancer Neg Hx    Ovarian cancer Neg Hx     Allergies  Allergen Reactions   Tape Other (See Comments)    sensitivity      PE Today's Vitals   12/18/22 1452  BP: 108/62  Pulse: 75  SpO2: 98%  Weight: 183 lb (83 kg)  Height: 5\' 4"  (1.626 m)   Body mass index is 31.41 kg/m.  Physical Exam Vitals reviewed. Exam conducted with a chaperone present.  Constitutional:      General: She is not in acute distress.    Appearance: Normal appearance.  HENT:     Head: Normocephalic and atraumatic.     Nose: Nose normal.  Eyes:     Extraocular Movements: Extraocular movements intact.     Conjunctiva/sclera: Conjunctivae normal.  Neck:     Thyroid: No thyroid mass, thyromegaly or thyroid tenderness.  Pulmonary:     Effort: Pulmonary effort is normal.  Chest:     Chest wall: No mass or tenderness.  Breasts:    Right: Normal. No swelling, mass, nipple discharge, skin change or tenderness.     Left: Normal. No swelling, mass, nipple discharge, skin change or tenderness.  Abdominal:     General: There is no distension.     Palpations: Abdomen is soft.     Tenderness: There is no abdominal tenderness.  Genitourinary:    General: Normal vulva.     Exam position: Lithotomy position.     Urethra: No prolapse.     Vagina: Normal. No vaginal discharge or bleeding.     Cervix: Normal. No lesion.     Uterus: Normal. Not enlarged and not tender.      Adnexa: Right adnexa normal and left adnexa normal.  Musculoskeletal:        General: Normal range of motion.      Cervical back: Normal range of motion.  Lymphadenopathy:     Upper Body:     Right upper body: No axillary adenopathy.     Left upper body: No axillary adenopathy.     Lower  Body: No right inguinal adenopathy. No left inguinal adenopathy.  Skin:    General: Skin is warm and dry.  Neurological:     General: No focal deficit present.     Mental Status: She is alert.  Psychiatric:        Mood and Affect: Mood normal.        Behavior: Behavior normal.       Assessment and Plan:        Well female exam with routine gynecological exam Assessment & Plan: Cervical cancer screening performed according to ASCCP guidelines. Labs and immunizations with her primary Encouraged safe sexual practices as indicated Encouraged healthy lifestyle practices with diet and exercise    Encounter for surveillance of vaginal ring hormonal contraceptive device -     Etonogestrel-Ethinyl Estradiol; Insert vaginally and leave in place for 3 consecutive weeks, then remove for 1 week.  Dispense: 3 each; Refill: 3  Screening examination for STD (sexually transmitted disease) -     Hepatitis B surface antigen -     Hepatitis C antibody -     SURESWAB CT/NG/T. vaginalis -     HIV Antibody (routine testing w rflx) -     RPR    Rosalyn Gess, MD

## 2022-12-18 NOTE — Patient Instructions (Signed)

## 2022-12-19 LAB — HEPATITIS B SURFACE ANTIGEN: Hepatitis B Surface Ag: NONREACTIVE

## 2022-12-19 LAB — SURESWAB CT/NG/T. VAGINALIS
C. trachomatis RNA, TMA: NOT DETECTED
N. gonorrhoeae RNA, TMA: NOT DETECTED
Trichomonas vaginalis RNA: NOT DETECTED

## 2022-12-19 LAB — HIV ANTIBODY (ROUTINE TESTING W REFLEX): HIV 1&2 Ab, 4th Generation: NONREACTIVE

## 2022-12-19 LAB — HEPATITIS C ANTIBODY: Hepatitis C Ab: NONREACTIVE

## 2022-12-19 LAB — RPR: RPR Ser Ql: NONREACTIVE

## 2023-01-05 ENCOUNTER — Telehealth: Payer: 59 | Admitting: Physician Assistant

## 2023-01-05 DIAGNOSIS — R3989 Other symptoms and signs involving the genitourinary system: Secondary | ICD-10-CM

## 2023-01-05 MED ORDER — NITROFURANTOIN MONOHYD MACRO 100 MG PO CAPS
100.0000 mg | ORAL_CAPSULE | Freq: Two times a day (BID) | ORAL | 0 refills | Status: DC
Start: 2023-01-05 — End: 2023-12-23

## 2023-01-05 NOTE — Progress Notes (Signed)

## 2023-01-09 ENCOUNTER — Ambulatory Visit (INDEPENDENT_AMBULATORY_CARE_PROVIDER_SITE_OTHER): Payer: 59 | Admitting: Radiology

## 2023-01-09 ENCOUNTER — Encounter: Payer: Self-pay | Admitting: Radiology

## 2023-01-09 VITALS — BP 114/72 | Temp 98.7°F

## 2023-01-09 DIAGNOSIS — Z113 Encounter for screening for infections with a predominantly sexual mode of transmission: Secondary | ICD-10-CM

## 2023-01-09 DIAGNOSIS — N760 Acute vaginitis: Secondary | ICD-10-CM

## 2023-01-09 DIAGNOSIS — R3989 Other symptoms and signs involving the genitourinary system: Secondary | ICD-10-CM

## 2023-01-09 LAB — WET PREP FOR TRICH, YEAST, CLUE

## 2023-01-09 LAB — URINALYSIS, COMPLETE W/RFL CULTURE
Bacteria, UA: NONE SEEN /[HPF]
Bilirubin Urine: NEGATIVE
Glucose, UA: NEGATIVE
Hgb urine dipstick: NEGATIVE
Hyaline Cast: NONE SEEN /[LPF]
Ketones, ur: NEGATIVE
Leukocyte Esterase: NEGATIVE
Nitrites, Initial: NEGATIVE
RBC / HPF: NONE SEEN /[HPF] (ref 0–2)
Specific Gravity, Urine: 1.025 (ref 1.001–1.035)
WBC, UA: NONE SEEN /[HPF] (ref 0–5)
pH: 6 (ref 5.0–8.0)

## 2023-01-09 LAB — NO CULTURE INDICATED

## 2023-01-09 NOTE — Progress Notes (Signed)
      Subjective: Catherine Shah is a 37 y.o. female who complains of vaginal discomfort, unsure if discharge, no itching, no odor. Treated for UTI thru e-visit 01/05/23 with Macrobid (has 2 doses left). Urinary urgency and burning have resolved, still with bladder pressure. Desires STI screening today.    Review of Systems  All other systems reviewed and are negative.   Past Medical History:  Diagnosis Date   Arrhythmia    Generalized anxiety disorder    Heart murmur    HPV (human papilloma virus) infection    Major depression    Panic attacks    Seizure (HCC)    last in 2013   STD (sexually transmitted disease)    hx of HPV and Chlamydia      Objective:  Today's Vitals   01/09/23 1102  BP: 114/72  Temp: 98.7 F (37.1 C)  TempSrc: Oral   There is no height or weight on file to calculate BMI.   Physical Exam Vitals and nursing note reviewed. Exam conducted with a chaperone present.  Constitutional:      Appearance: Normal appearance. She is well-developed.  Pulmonary:     Effort: Pulmonary effort is normal.  Abdominal:     General: Abdomen is flat.     Palpations: Abdomen is soft.  Genitourinary:    General: Normal vulva.     Vagina: Vaginal discharge present. No erythema, bleeding or lesions.     Cervix: Normal. No discharge, friability, lesion or erythema.     Uterus: Normal.      Adnexa: Right adnexa normal and left adnexa normal.  Neurological:     Mental Status: She is alert.  Psychiatric:        Mood and Affect: Mood normal.        Thought Content: Thought content normal.        Judgment: Judgment normal.      Microscopic wet-mount exam shows negative for pathogens, normal epithelial cells.   Catherine Shah, CMA present for exam  Assessment:/Plan:   1. Sensation of pressure in bladder area Reassured negative u/a - Urinalysis,Complete w/RFL Culture  2. Acute vaginitis Negative wet prep Baking soda soaks Coconut oil prn - WET PREP FOR TRICH,  YEAST, CLUE  3. Screening for STDs (sexually transmitted diseases) - SURESWAB CT/NG/T. vaginalis    Will contact patient with results of testing completed today. Avoid intercourse until symptoms are resolved. Safe sex encouraged. Avoid the use of soaps or perfumed products in the peri area. Avoid tub baths and sitting in sweaty or wet clothing for prolonged periods of time.   Return if symptoms worsen or fail to improve.   Catherine Shah B, NP 11:19 AM

## 2023-01-10 LAB — SURESWAB CT/NG/T. VAGINALIS
C. trachomatis RNA, TMA: NOT DETECTED
N. gonorrhoeae RNA, TMA: NOT DETECTED
Trichomonas vaginalis RNA: NOT DETECTED

## 2023-01-11 DIAGNOSIS — R102 Pelvic and perineal pain: Secondary | ICD-10-CM

## 2023-01-12 ENCOUNTER — Other Ambulatory Visit: Payer: 59

## 2023-01-12 DIAGNOSIS — R102 Pelvic and perineal pain: Secondary | ICD-10-CM

## 2023-01-12 NOTE — Telephone Encounter (Signed)
Pt scheduled and order placed. Routing to provider for final review.

## 2023-01-12 NOTE — Telephone Encounter (Signed)
Yes, lab only visit OK this time since she was just here. But if negative, another OV will be recommended to evaluate causes for pelvic pain.

## 2023-01-12 NOTE — Telephone Encounter (Signed)
Treated for possible UTI 01/05/23 by E-visit elsewhere. 01/09/23 with JC - neg UA and culture, neg STD screening, neg wet prep. Chance that culture not accurate due to antibiotic use at that time. Recommend coming in for UA now that she if off antibiotics.

## 2023-01-14 LAB — URINE CULTURE
MICRO NUMBER:: 15681582
Result:: NO GROWTH
SPECIMEN QUALITY:: ADEQUATE

## 2023-01-14 LAB — URINALYSIS, COMPLETE W/RFL CULTURE
Bilirubin Urine: NEGATIVE
Casts: NONE SEEN /LPF
Crystals: NONE SEEN /HPF
Glucose, UA: NEGATIVE
Hgb urine dipstick: NEGATIVE
Ketones, ur: NEGATIVE
Leukocyte Esterase: NEGATIVE
Nitrites, Initial: NEGATIVE
Protein, ur: NEGATIVE
RBC / HPF: NONE SEEN /[HPF] (ref 0–2)
Specific Gravity, Urine: 1.02 (ref 1.001–1.035)
Yeast: NONE SEEN /HPF
pH: 7.5 (ref 5.0–8.0)

## 2023-01-14 LAB — CULTURE INDICATED

## 2023-01-27 ENCOUNTER — Telehealth: Payer: 59 | Admitting: Family Medicine

## 2023-01-27 DIAGNOSIS — N390 Urinary tract infection, site not specified: Secondary | ICD-10-CM

## 2023-01-27 NOTE — Progress Notes (Signed)
Because recurrent UTIs in last month, your condition warrants further evaluation and I recommend that you be seen in a face to face visit at your PCP, GYN or urgent care.   NOTE: There will be NO CHARGE for this eVisit   If you are having a true medical emergency please call 911.

## 2023-07-27 DIAGNOSIS — F331 Major depressive disorder, recurrent, moderate: Secondary | ICD-10-CM | POA: Diagnosis not present

## 2023-07-28 DIAGNOSIS — F902 Attention-deficit hyperactivity disorder, combined type: Secondary | ICD-10-CM | POA: Diagnosis not present

## 2023-07-28 DIAGNOSIS — F4323 Adjustment disorder with mixed anxiety and depressed mood: Secondary | ICD-10-CM | POA: Diagnosis not present

## 2023-08-04 DIAGNOSIS — F902 Attention-deficit hyperactivity disorder, combined type: Secondary | ICD-10-CM | POA: Diagnosis not present

## 2023-08-04 DIAGNOSIS — F4323 Adjustment disorder with mixed anxiety and depressed mood: Secondary | ICD-10-CM | POA: Diagnosis not present

## 2023-08-11 DIAGNOSIS — F902 Attention-deficit hyperactivity disorder, combined type: Secondary | ICD-10-CM | POA: Diagnosis not present

## 2023-08-11 DIAGNOSIS — F4323 Adjustment disorder with mixed anxiety and depressed mood: Secondary | ICD-10-CM | POA: Diagnosis not present
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
## 2023-08-18 DIAGNOSIS — F902 Attention-deficit hyperactivity disorder, combined type: Secondary | ICD-10-CM | POA: Diagnosis not present

## 2023-08-18 DIAGNOSIS — F4323 Adjustment disorder with mixed anxiety and depressed mood: Secondary | ICD-10-CM | POA: Diagnosis not present

## 2023-09-01 DIAGNOSIS — F4323 Adjustment disorder with mixed anxiety and depressed mood: Secondary | ICD-10-CM | POA: Diagnosis not present

## 2023-09-01 DIAGNOSIS — F902 Attention-deficit hyperactivity disorder, combined type: Secondary | ICD-10-CM | POA: Diagnosis not present

## 2023-09-07 DIAGNOSIS — F4323 Adjustment disorder with mixed anxiety and depressed mood: Secondary | ICD-10-CM | POA: Diagnosis not present

## 2023-09-07 DIAGNOSIS — F902 Attention-deficit hyperactivity disorder, combined type: Secondary | ICD-10-CM | POA: Diagnosis not present

## 2023-09-17 DIAGNOSIS — F4323 Adjustment disorder with mixed anxiety and depressed mood: Secondary | ICD-10-CM | POA: Diagnosis not present

## 2023-09-17 DIAGNOSIS — F902 Attention-deficit hyperactivity disorder, combined type: Secondary | ICD-10-CM | POA: Diagnosis not present

## 2023-09-22 DIAGNOSIS — F902 Attention-deficit hyperactivity disorder, combined type: Secondary | ICD-10-CM | POA: Diagnosis not present

## 2023-09-22 DIAGNOSIS — F4323 Adjustment disorder with mixed anxiety and depressed mood: Secondary | ICD-10-CM | POA: Diagnosis not present

## 2023-10-01 DIAGNOSIS — F4323 Adjustment disorder with mixed anxiety and depressed mood: Secondary | ICD-10-CM | POA: Diagnosis not present

## 2023-10-01 DIAGNOSIS — F902 Attention-deficit hyperactivity disorder, combined type: Secondary | ICD-10-CM | POA: Diagnosis not present

## 2023-10-08 DIAGNOSIS — F902 Attention-deficit hyperactivity disorder, combined type: Secondary | ICD-10-CM | POA: Diagnosis not present

## 2023-10-08 DIAGNOSIS — F4323 Adjustment disorder with mixed anxiety and depressed mood: Secondary | ICD-10-CM | POA: Diagnosis not present

## 2023-10-15 DIAGNOSIS — F902 Attention-deficit hyperactivity disorder, combined type: Secondary | ICD-10-CM | POA: Diagnosis not present

## 2023-10-15 DIAGNOSIS — F4323 Adjustment disorder with mixed anxiety and depressed mood: Secondary | ICD-10-CM | POA: Diagnosis not present

## 2023-10-19 DIAGNOSIS — F331 Major depressive disorder, recurrent, moderate: Secondary | ICD-10-CM | POA: Diagnosis not present

## 2023-10-21 DIAGNOSIS — F4323 Adjustment disorder with mixed anxiety and depressed mood: Secondary | ICD-10-CM | POA: Diagnosis not present

## 2023-10-21 DIAGNOSIS — F902 Attention-deficit hyperactivity disorder, combined type: Secondary | ICD-10-CM | POA: Diagnosis not present

## 2023-10-28 DIAGNOSIS — F4323 Adjustment disorder with mixed anxiety and depressed mood: Secondary | ICD-10-CM | POA: Diagnosis not present

## 2023-10-28 DIAGNOSIS — F902 Attention-deficit hyperactivity disorder, combined type: Secondary | ICD-10-CM | POA: Diagnosis not present

## 2023-10-30 DIAGNOSIS — Z6832 Body mass index (BMI) 32.0-32.9, adult: Secondary | ICD-10-CM | POA: Diagnosis not present

## 2023-10-30 DIAGNOSIS — L02224 Furuncle of groin: Secondary | ICD-10-CM | POA: Diagnosis not present

## 2023-10-30 DIAGNOSIS — L720 Epidermal cyst: Secondary | ICD-10-CM | POA: Diagnosis not present

## 2023-11-04 DIAGNOSIS — F4323 Adjustment disorder with mixed anxiety and depressed mood: Secondary | ICD-10-CM | POA: Diagnosis not present

## 2023-11-04 DIAGNOSIS — F902 Attention-deficit hyperactivity disorder, combined type: Secondary | ICD-10-CM | POA: Diagnosis not present

## 2023-11-11 DIAGNOSIS — F4323 Adjustment disorder with mixed anxiety and depressed mood: Secondary | ICD-10-CM | POA: Diagnosis not present

## 2023-11-11 DIAGNOSIS — F902 Attention-deficit hyperactivity disorder, combined type: Secondary | ICD-10-CM | POA: Diagnosis not present

## 2023-11-18 DIAGNOSIS — F902 Attention-deficit hyperactivity disorder, combined type: Secondary | ICD-10-CM | POA: Diagnosis not present

## 2023-11-18 DIAGNOSIS — F4323 Adjustment disorder with mixed anxiety and depressed mood: Secondary | ICD-10-CM | POA: Diagnosis not present

## 2023-11-25 DIAGNOSIS — F4323 Adjustment disorder with mixed anxiety and depressed mood: Secondary | ICD-10-CM | POA: Diagnosis not present

## 2023-11-25 DIAGNOSIS — F902 Attention-deficit hyperactivity disorder, combined type: Secondary | ICD-10-CM | POA: Diagnosis not present

## 2023-12-02 DIAGNOSIS — F902 Attention-deficit hyperactivity disorder, combined type: Secondary | ICD-10-CM | POA: Diagnosis not present

## 2023-12-02 DIAGNOSIS — F4323 Adjustment disorder with mixed anxiety and depressed mood: Secondary | ICD-10-CM | POA: Diagnosis not present

## 2023-12-09 DIAGNOSIS — F902 Attention-deficit hyperactivity disorder, combined type: Secondary | ICD-10-CM | POA: Diagnosis not present

## 2023-12-16 DIAGNOSIS — F4323 Adjustment disorder with mixed anxiety and depressed mood: Secondary | ICD-10-CM | POA: Diagnosis not present

## 2023-12-16 DIAGNOSIS — F902 Attention-deficit hyperactivity disorder, combined type: Secondary | ICD-10-CM | POA: Diagnosis not present

## 2023-12-21 DIAGNOSIS — F331 Major depressive disorder, recurrent, moderate: Secondary | ICD-10-CM | POA: Diagnosis not present

## 2023-12-23 ENCOUNTER — Ambulatory Visit (INDEPENDENT_AMBULATORY_CARE_PROVIDER_SITE_OTHER): Admitting: Family

## 2023-12-23 ENCOUNTER — Encounter: Payer: Self-pay | Admitting: Family

## 2023-12-23 VITALS — BP 120/78 | HR 84 | Temp 98.6°F | Ht 64.0 in | Wt 199.4 lb

## 2023-12-23 DIAGNOSIS — R051 Acute cough: Secondary | ICD-10-CM

## 2023-12-23 LAB — POC COVID19 BINAXNOW: SARS Coronavirus 2 Ag: NEGATIVE

## 2023-12-23 LAB — POCT INFLUENZA A/B
Influenza A, POC: NEGATIVE
Influenza B, POC: NEGATIVE

## 2023-12-23 MED ORDER — AMOXICILLIN-POT CLAVULANATE 875-125 MG PO TABS: 1.0000 | ORAL_TABLET | Freq: Two times a day (BID) | ORAL | 0 refills | Status: DC

## 2023-12-23 NOTE — Progress Notes (Signed)
 Established Patient Office Visit  Subjective:      CC:  Chief Complaint  Patient presents with   Cough    C/o cough, head congestion, drainage, nausea, facial pain/pressure and fatigue. Sxs started about 2 wks ago.     HPI: Catherine Shah is a 38 y.o. female presenting on 12/23/2023 for Cough (C/o cough, head congestion, drainage, nausea, facial pain/pressure and fatigue. Sxs started about 2 wks ago. ) .  Discussed the use of AI scribe software for clinical note transcription with the patient, who gave verbal consent to proceed.  History of Present Illness Catherine Shah is a 38 year old female who presents with a two-week history of cough and congestion.  She has been experiencing a persistent cough and nasal congestion for the past two weeks, with the cough being particularly bothersome at night. She feels generally fatigued. There is sinus pressure in her face and mild ear pain, though the ear pain is not severe.  She has attempted to manage her symptoms with over-the-counter Mucinex, taking it as a pill during the day, but it has not provided significant relief.   No shortness of breath or wheezing is present. She denies having a sore throat or fever.         Social history:  Relevant past medical, surgical, family and social history reviewed and updated as indicated. Interim medical history since our last visit reviewed.  Allergies and medications reviewed and updated.  DATA REVIEWED: CHART IN EPIC     ROS: Negative unless specifically indicated above in HPI.    Current Outpatient Medications:    amoxicillin -clavulanate (AUGMENTIN ) 875-125 MG tablet, Take 1 tablet by mouth 2 (two) times daily., Disp: 20 tablet, Rfl: 0   buPROPion  (WELLBUTRIN  SR) 150 MG 12 hr tablet, Take 300 mg by mouth every morning., Disp: , Rfl:    Calcium Carb-Cholecalciferol (CALCIUM 500/VITAMIN D PO), Take 1 tablet by mouth in the morning, at noon, and at bedtime., Disp: , Rfl:    citalopram   (CELEXA ) 40 MG tablet, Take 40 mg by mouth daily., Disp: , Rfl:    etonogestrel -ethinyl estradiol  (NUVARING ) 0.12-0.015 MG/24HR vaginal ring, Insert vaginally and leave in place for 3 consecutive weeks, then remove for 1 week., Disp: 3 each, Rfl: 3   gabapentin  (NEURONTIN ) 100 MG capsule, Take 100 mg by mouth 3 (three) times daily as needed (pain)., Disp: , Rfl:    gabapentin  (NEURONTIN ) 800 MG tablet, Take 800 mg by mouth at bedtime. , Disp: , Rfl:    methylphenidate (RITALIN) 10 MG tablet, Take 10 mg by mouth 3 (three) times daily., Disp: , Rfl:    Multiple Vitamin (MULTI-VITAMIN) tablet, Take 1 tablet by mouth daily., Disp: , Rfl:    Multiple Vitamins-Minerals (BARIATRIC MULTIVITAMINS/IRON PO), Take 1 tablet by mouth in the morning and at bedtime., Disp: , Rfl:    triamcinolone  (KENALOG ) 0.025 % ointment, Apply 1 Application topically 2 (two) times daily., Disp: 30 g, Rfl: 0        Objective:        BP 120/78   Pulse 84   Temp 98.6 F (37 C) (Temporal)   Ht 5' 4 (1.626 m)   Wt 199 lb 6 oz (90.4 kg)   LMP 12/22/2023   SpO2 98%   BMI 34.22 kg/m   Physical Exam HEENT: Right ear slightly erythematous, no infection. Sinus tenderness and swelling present.  Wt Readings from Last 3 Encounters:  12/23/23 199 lb 6 oz (90.4 kg)  12/18/22  183 lb (83 kg)  08/18/22 185 lb (83.9 kg)    Physical Exam Vitals reviewed.  Constitutional:      General: She is not in acute distress.    Appearance: Normal appearance. She is normal weight. She is not ill-appearing, toxic-appearing or diaphoretic.  HENT:     Head: Normocephalic.     Right Ear: Tympanic membrane is erythematous. Tympanic membrane is not retracted or bulging.     Nose:     Right Sinus: Maxillary sinus tenderness present.     Left Sinus: Maxillary sinus tenderness present.  Cardiovascular:     Rate and Rhythm: Normal rate.  Pulmonary:     Effort: Pulmonary effort is normal.  Musculoskeletal:        General: Normal range  of motion.  Neurological:     General: No focal deficit present.     Mental Status: She is alert and oriented to person, place, and time. Mental status is at baseline.  Psychiatric:        Mood and Affect: Mood normal.        Behavior: Behavior normal.        Thought Content: Thought content normal.        Judgment: Judgment normal.          Results   Assessment & Plan:   Assessment and Plan Assessment & Plan Acute upper respiratory infection with cough and sinus symptoms Presents with a two-week history of cough and congestion, worsening at night. Reports sinus pressure in the face and mild ear pain, but no sore throat or fever. Examination reveals a slightly red ear and swollen upper torso, but no infection. Symptoms consistent with an acute upper respiratory infection, likely involving the sinuses. Decision to treat with antibiotics based on symptom duration and sinus pressure, indicating a possible bacterial component. - rx augmentin  875/125 mg po bid x 10 days Take antibiotic as prescribed. Increase oral fluids. Pt to f/u if sx worsen and or fail to improve in 2-3 days. -covid and flu tested in office, both negative - Recommend ibuprofen for sinus pressure relief. - Advise continued use of Mucinex to thin sputum.       Return if symptoms worsen or fail to improve.     Ginger Patrick, MSN, APRN, FNP-C [facility]

## 2023-12-30 DIAGNOSIS — F902 Attention-deficit hyperactivity disorder, combined type: Secondary | ICD-10-CM | POA: Diagnosis not present

## 2024-01-07 DIAGNOSIS — F902 Attention-deficit hyperactivity disorder, combined type: Secondary | ICD-10-CM | POA: Diagnosis not present

## 2024-01-14 DIAGNOSIS — F902 Attention-deficit hyperactivity disorder, combined type: Secondary | ICD-10-CM | POA: Diagnosis not present

## 2024-01-19 ENCOUNTER — Ambulatory Visit (INDEPENDENT_AMBULATORY_CARE_PROVIDER_SITE_OTHER): Admitting: Obstetrics and Gynecology

## 2024-01-19 ENCOUNTER — Encounter: Payer: Self-pay | Admitting: Obstetrics and Gynecology

## 2024-01-19 VITALS — BP 120/76 | HR 73 | Temp 97.9°F | Ht 64.0 in | Wt 199.0 lb

## 2024-01-19 DIAGNOSIS — Z1331 Encounter for screening for depression: Secondary | ICD-10-CM | POA: Diagnosis not present

## 2024-01-19 DIAGNOSIS — Z6834 Body mass index (BMI) 34.0-34.9, adult: Secondary | ICD-10-CM

## 2024-01-19 DIAGNOSIS — Z113 Encounter for screening for infections with a predominantly sexual mode of transmission: Secondary | ICD-10-CM | POA: Diagnosis not present

## 2024-01-19 DIAGNOSIS — Z01419 Encounter for gynecological examination (general) (routine) without abnormal findings: Secondary | ICD-10-CM | POA: Diagnosis not present

## 2024-01-19 DIAGNOSIS — Z3044 Encounter for surveillance of vaginal ring hormonal contraceptive device: Secondary | ICD-10-CM | POA: Insufficient documentation

## 2024-01-19 MED ORDER — ETONOGESTREL-ETHINYL ESTRADIOL 0.12-0.015 MG/24HR VA RING: VAGINAL_RING | VAGINAL | 4 refills | Status: AC

## 2024-01-19 NOTE — Patient Instructions (Signed)

## 2024-01-19 NOTE — Progress Notes (Signed)
 38 y.o. G0P0000 female s/p sleeve gastrectomy with epilepsy and mood disorder here for annual exam. Single. PCP: Cleatus Arlyss RAMAN, MD  Patient's last menstrual period was 01/19/2024 (exact date). Period Cycle (Days): 28 Period Duration (Days): 5 Period Pattern: Regular Menstrual Flow: Moderate Menstrual Control: Other (Comment), Tampon (Cup) Dysmenorrhea: (!) Mild Dysmenorrhea Symptoms: Cramping, Diarrhea  Pt would like Std screening. No recently SA so stopped nuvaring  but wants refill.  Planning to start cooking to improve diet. Gained 16# over past year.  Abnormal bleeding: none Pelvic discharge or pain: none Breast mass, nipple discharge or skin changes : none Birth control: nuva ring Last PAP:     Component Value Date/Time   DIAGPAP  12/05/2021 1354    - Negative for intraepithelial lesion or malignancy (NILM)   DIAGPAP  09/17/2020 0927    - Negative for intraepithelial lesion or malignancy (NILM)   HPVHIGH Negative 09/17/2020 0927   ADEQPAP  12/05/2021 1354    Satisfactory for evaluation; transformation zone component PRESENT.   ADEQPAP  09/17/2020 0927    Satisfactory for evaluation; transformation zone component ABSENT.   Gardasil: yes Sexually active: yes, casual dating Exercising: no, planning to restart, bought equipment for at home gym (stair stepper, bike, weights Smoker: no  Garment/textile Technologist Visit from 01/19/2024 in [facility]  PHQ-2 Total Score 0    GYN HISTORY: No significant hx  OB History  Gravida Para Term Preterm AB Living  0 0 0 0 0 0  SAB IAB Ectopic Multiple Live Births  0 0 0 0 0    Past Medical History:  Diagnosis Date   Arrhythmia    Generalized anxiety disorder    Heart murmur    HPV (human papilloma virus) infection    Major depression    Panic attacks    Seizure (HCC)    last in 2013   STD (sexually transmitted disease)    hx of HPV and Chlamydia    Past Surgical History:  Procedure  Laterality Date   LAPAROSCOPIC GASTRIC SLEEVE RESECTION N/A 05/06/2021   Procedure: LAPAROSCOPIC GASTRIC SLEEVE RESECTION;  Surgeon: Stevie, Herlene Righter, MD;  Location: [facility];  Service: General;  Laterality: N/A;   TONSILLECTOMY AND ADENOIDECTOMY  1994   TOOTH EXTRACTION  age 63   UPPER GI ENDOSCOPY N/A 05/06/2021   Procedure: UPPER GI ENDOSCOPY;  Surgeon: Stevie Herlene Righter, MD;  Location: [facility];  Service: General;  Laterality: N/A;    Current Outpatient Medications on File Prior to Visit  Medication Sig Dispense Refill   buPROPion  (WELLBUTRIN  SR) 150 MG 12 hr tablet Take 300 mg by mouth every morning.     citalopram  (CELEXA ) 40 MG tablet Take 40 mg by mouth daily.     ferrous sulfate 325 (65 FE) MG tablet Take 325 mg by mouth daily with breakfast.     gabapentin  (NEURONTIN ) 100 MG capsule Take 100 mg by mouth 3 (three) times daily as needed (pain).     gabapentin  (NEURONTIN ) 800 MG tablet Take 800 mg by mouth at bedtime.      methylphenidate (RITALIN) 10 MG tablet Take 10 mg by mouth 3 (three) times daily.     Multiple Vitamin (MULTI-VITAMIN) tablet Take 1 tablet by mouth daily.     triamcinolone  (KENALOG ) 0.025 % ointment Apply 1 Application topically 2 (two) times daily. 30 g 0   Vitamin D, Ergocalciferol, (DRISDOL) 1.25 MG (50000 UNIT) CAPS capsule Take 50,000 Units by mouth every 7 (  seven) days.     No current facility-administered medications on file prior to visit.    Social History   Socioeconomic History   Marital status: Single    Spouse name: Not on file   Number of children: 0   Years of education: Not on file   Highest education level: Master's degree (e.g., MA, MS, MEng, MEd, MSW, MBA)  Occupational History   Occupation: Child Psychotherapist  Tobacco Use   Smoking status: Never   Smokeless tobacco: Never  Vaping Use   Vaping status: Never Used  Substance and Sexual Activity   Alcohol use: Not Currently    Comment: rarely   Drug use: No   Sexual activity: Yes     Partners: Male    Birth control/protection: Inserts    Comment: Nuvaring   Other Topics Concern   Not on file  Social History Narrative   Not on file   Social Drivers of Health   Financial Resource Strain: Low Risk  (12/22/2023)   Overall Financial Resource Strain (CARDIA)    Difficulty of Paying Living Expenses: Not hard at all  Food Insecurity: No Food Insecurity (12/22/2023)   Hunger Vital Sign    Worried About Running Out of Food in the Last Year: Never true    Ran Out of Food in the Last Year: Never true  Transportation Needs: No Transportation Needs (12/22/2023)   PRAPARE - Administrator, Civil Service (Medical): No    Lack of Transportation (Non-Medical): No  Physical Activity: Inactive (12/22/2023)   Exercise Vital Sign    Days of Exercise per Week: 0 days    Minutes of Exercise per Session: Not on file  Stress: No Stress Concern Present (12/22/2023)   [employer] of Occupational Health - Occupational Stress Questionnaire    Feeling of Stress: Only a little  Social Connections: Moderately Isolated (12/22/2023)   Social Connection and Isolation Panel    Frequency of Communication with Friends and Family: More than three times a week    Frequency of Social Gatherings with Friends and Family: Once a week    Attends Religious Services: Never    Database Administrator or Organizations: Yes    Attends Banker Meetings: 1 to 4 times per year    Marital Status: Never married  Catering Manager Violence: Not on file    Family History  Problem Relation Age of Onset   Hypertension Mother    Depression Mother    Diabetes Mother    Stroke Mother    Cancer Father        Bile duct cancer   Hypertension Maternal Grandmother    Depression Maternal Grandmother    Hypertension Maternal Grandfather    Coronary artery disease Other        [facility]   Cancer Other        [facility]   Stroke Other        [facility]   Lung disease Other        [facility]   Breast cancer  Neg Hx    Ovarian cancer Neg Hx     Allergies  Allergen Reactions   Tape Other (See Comments)    sensitivity      PE Today's Vitals   01/19/24 0846  BP: 120/76  Pulse: 73  Temp: 97.9 F (36.6 C)  TempSrc: Oral  SpO2: 98%  Weight: 199 lb (90.3 kg)  Height: 5' 4 (1.626 m)    Body mass index is 34.16  kg/m.  Physical Exam Vitals reviewed. Exam conducted with a chaperone present.  Constitutional:      General: She is not in acute distress.    Appearance: Normal appearance.  HENT:     Head: Normocephalic and atraumatic.     Nose: Nose normal.  Eyes:     Extraocular Movements: Extraocular movements intact.     Conjunctiva/sclera: Conjunctivae normal.  Pulmonary:     Effort: Pulmonary effort is normal.  Chest:     Chest wall: No mass or tenderness.  Breasts:    Right: Normal. No swelling, mass, nipple discharge, skin change or tenderness.     Left: Normal. No swelling, mass, nipple discharge, skin change or tenderness.  Abdominal:     General: There is no distension.     Palpations: Abdomen is soft.     Tenderness: There is no abdominal tenderness.  Genitourinary:    General: Normal vulva.     Exam position: Lithotomy position.     Urethra: No prolapse.     Vagina: Normal. No vaginal discharge or bleeding.     Cervix: Normal. No lesion.     Uterus: Normal. Not enlarged and not tender.      Adnexa: Right adnexa normal and left adnexa normal.     Comments: +light menses Musculoskeletal:        General: Normal range of motion.     Cervical back: Normal range of motion.  Lymphadenopathy:     Upper Body:     Right upper body: No axillary adenopathy.     Left upper body: No axillary adenopathy.     Lower Body: No right inguinal adenopathy. No left inguinal adenopathy.  Skin:    General: Skin is warm and dry.  Neurological:     General: No focal deficit present.     Mental Status: She is alert.  Psychiatric:        Mood and Affect: Mood normal.         Behavior: Behavior normal.       Assessment and Plan:        Well woman exam with routine gynecological exam Assessment & Plan: Cervical cancer screening performed according to ASCCP guidelines.  Labs and immunizations with her primary Encouraged safe sexual practices as indicated Encouraged healthy lifestyle practices with diet and exercise For patients under 50yo, I recommend 1000mg  calcium daily and 600IU of vitamin D daily.    Screening for depression  Screen for STD (sexually transmitted disease) -     Hepatitis B surface antigen -     Hepatitis C antibody -     RPR -     HIV Antibody (routine testing w rflx) -     SURESWAB CT/NG/T. vaginalis  Encounter for surveillance of vaginal ring hormonal contraceptive device -     Etonogestrel -Ethinyl Estradiol ; Insert vaginally and leave in place for 3 consecutive weeks, then remove for 1 week.  Dispense: 3 each; Refill: 4  Adult BMI 34.0-34.9 kg/sq m Assessment & Plan: Encouraged healthy diet and exercise.    Catherine LULLA Pa, MD

## 2024-01-19 NOTE — Assessment & Plan Note (Signed)
 Encouraged healthy diet and exercise.

## 2024-01-19 NOTE — Assessment & Plan Note (Signed)
 Cervical cancer screening performed according to ASCCP guidelines. Labs and immunizations with her primary Encouraged safe sexual practices as indicated Encouraged healthy lifestyle practices with diet and exercise For patients under 38yo, I recommend 1000mg  calcium daily and 600IU of vitamin D daily.

## 2024-01-20 ENCOUNTER — Ambulatory Visit: Payer: Self-pay | Admitting: Obstetrics and Gynecology

## 2024-01-20 DIAGNOSIS — F902 Attention-deficit hyperactivity disorder, combined type: Secondary | ICD-10-CM | POA: Diagnosis not present

## 2024-01-20 LAB — SURESWAB CT/NG/T. VAGINALIS
C. trachomatis RNA, TMA: NOT DETECTED
N. gonorrhoeae RNA, TMA: NOT DETECTED
Trichomonas vaginalis RNA: NOT DETECTED

## 2024-01-20 LAB — HEPATITIS C ANTIBODY: Hepatitis C Ab: NONREACTIVE

## 2024-01-20 LAB — HIV ANTIBODY (ROUTINE TESTING W REFLEX)
HIV 1&2 Ab, 4th Generation: NONREACTIVE
HIV FINAL INTERPRETATION: NEGATIVE

## 2024-01-20 LAB — HEPATITIS B SURFACE ANTIGEN: Hepatitis B Surface Ag: NONREACTIVE

## 2024-01-20 LAB — RPR: RPR Ser Ql: NONREACTIVE

## 2024-02-03 DIAGNOSIS — F902 Attention-deficit hyperactivity disorder, combined type: Secondary | ICD-10-CM | POA: Diagnosis not present

## 2024-02-03 DIAGNOSIS — F4323 Adjustment disorder with mixed anxiety and depressed mood: Secondary | ICD-10-CM | POA: Diagnosis not present

## 2024-02-10 DIAGNOSIS — F902 Attention-deficit hyperactivity disorder, combined type: Secondary | ICD-10-CM | POA: Diagnosis not present

## 2024-02-10 DIAGNOSIS — F4323 Adjustment disorder with mixed anxiety and depressed mood: Secondary | ICD-10-CM | POA: Diagnosis not present

## 2024-02-17 DIAGNOSIS — F4323 Adjustment disorder with mixed anxiety and depressed mood: Secondary | ICD-10-CM | POA: Diagnosis not present

## 2024-02-17 DIAGNOSIS — F902 Attention-deficit hyperactivity disorder, combined type: Secondary | ICD-10-CM | POA: Diagnosis not present

## 2024-02-18 DIAGNOSIS — F331 Major depressive disorder, recurrent, moderate: Secondary | ICD-10-CM | POA: Diagnosis not present

## 2024-02-24 DIAGNOSIS — F902 Attention-deficit hyperactivity disorder, combined type: Secondary | ICD-10-CM | POA: Diagnosis not present

## 2024-02-24 DIAGNOSIS — F4323 Adjustment disorder with mixed anxiety and depressed mood: Secondary | ICD-10-CM | POA: Diagnosis not present

## 2024-02-25 ENCOUNTER — Other Ambulatory Visit: Payer: Self-pay | Admitting: Obstetrics and Gynecology

## 2024-02-25 DIAGNOSIS — Z3044 Encounter for surveillance of vaginal ring hormonal contraceptive device: Secondary | ICD-10-CM

## 2024-02-26 NOTE — Telephone Encounter (Signed)
 Med refill request:    etonogestrel -ethinyl estradiol  (NUVARING ) 0.12-0.015 MG/24HR vaginal ring  Start:  01/19/24 Disp:   3 each Refills:  4  Last AEX:  01/19/24 Next AEX:  Not yet scheduled Last MMG (if hormonal med):  N/A Refill authorized? Please Advise.
